# Patient Record
Sex: Female | Born: 1980 | Race: White | Hispanic: No | Marital: Married | Smoking: Former smoker
Health system: Southern US, Community
[De-identification: ages and names within clinical notes are randomized; demographics above are authoritative.]

## PROBLEM LIST (undated history)

## (undated) DIAGNOSIS — F329 Major depressive disorder, single episode, unspecified: Secondary | ICD-10-CM

## (undated) DIAGNOSIS — C8590 Non-Hodgkin lymphoma, unspecified, unspecified site: Secondary | ICD-10-CM

## (undated) DIAGNOSIS — F32A Depression, unspecified: Secondary | ICD-10-CM

## (undated) DIAGNOSIS — F419 Anxiety disorder, unspecified: Secondary | ICD-10-CM

## (undated) HISTORY — PX: LYMPH NODE BIOPSY: SHX201

## (undated) HISTORY — PX: HERNIA REPAIR: SHX51

---

## 2000-10-05 ENCOUNTER — Other Ambulatory Visit: Admission: RE | Admit: 2000-10-05 | Discharge: 2000-10-05 | Payer: Self-pay | Admitting: *Deleted

## 2002-09-09 ENCOUNTER — Encounter: Payer: Self-pay | Admitting: Emergency Medicine

## 2002-09-09 ENCOUNTER — Emergency Department (HOSPITAL_COMMUNITY): Admission: EM | Admit: 2002-09-09 | Discharge: 2002-09-09 | Payer: Self-pay | Admitting: Emergency Medicine

## 2005-01-31 ENCOUNTER — Emergency Department (HOSPITAL_COMMUNITY): Admission: EM | Admit: 2005-01-31 | Discharge: 2005-01-31 | Payer: Self-pay | Admitting: Emergency Medicine

## 2005-10-26 ENCOUNTER — Other Ambulatory Visit: Admission: RE | Admit: 2005-10-26 | Discharge: 2005-10-26 | Payer: Self-pay | Admitting: Obstetrics and Gynecology

## 2006-03-25 ENCOUNTER — Inpatient Hospital Stay (HOSPITAL_COMMUNITY): Admission: AD | Admit: 2006-03-25 | Discharge: 2006-03-25 | Payer: Self-pay | Admitting: *Deleted

## 2006-04-01 ENCOUNTER — Inpatient Hospital Stay (HOSPITAL_COMMUNITY): Admission: AD | Admit: 2006-04-01 | Discharge: 2006-04-03 | Payer: Self-pay | Admitting: Obstetrics and Gynecology

## 2006-04-01 ENCOUNTER — Encounter (INDEPENDENT_AMBULATORY_CARE_PROVIDER_SITE_OTHER): Payer: Self-pay | Admitting: *Deleted

## 2010-09-12 ENCOUNTER — Inpatient Hospital Stay (HOSPITAL_COMMUNITY)
Admission: EM | Admit: 2010-09-12 | Discharge: 2010-09-15 | Payer: Self-pay | Source: Home / Self Care | Attending: Internal Medicine | Admitting: Internal Medicine

## 2010-12-05 LAB — COMPREHENSIVE METABOLIC PANEL
ALT: 51 U/L — ABNORMAL HIGH (ref 0–35)
AST: 238 U/L — ABNORMAL HIGH (ref 0–37)
AST: 271 U/L — ABNORMAL HIGH (ref 0–37)
Albumin: 3 g/dL — ABNORMAL LOW (ref 3.5–5.2)
Calcium: 7.7 mg/dL — ABNORMAL LOW (ref 8.4–10.5)
Chloride: 99 mEq/L (ref 96–112)
Creatinine, Ser: 0.47 mg/dL (ref 0.4–1.2)
Creatinine, Ser: 0.56 mg/dL (ref 0.4–1.2)
GFR calc Af Amer: >60 mL/min (ref >60)
GFR calc Af Amer: >60 mL/min (ref >60)
GFR calc non Af Amer: >60 mL/min (ref >60)
Sodium: 136 mEq/L (ref 135–145)
Total Bilirubin: 5.3 mg/dL — ABNORMAL HIGH (ref 0.3–1.2)
Total Protein: 7.5 g/dL (ref 6.0–8.3)
Total Protein: 7.5 g/dL (ref 6.0–8.3)

## 2010-12-05 LAB — BASIC METABOLIC PANEL
BUN: 1 mg/dL — ABNORMAL LOW (ref 6–23)
CO2: 29 mEq/L (ref 19–32)
Chloride: 101 mEq/L (ref 96–112)
Creatinine, Ser: 0.47 mg/dL (ref 0.4–1.2)

## 2010-12-05 LAB — CBC
MCH: 37.4 pg — ABNORMAL HIGH (ref 26.0–34.0)
MCH: 37.7 pg — ABNORMAL HIGH (ref 26.0–34.0)
MCH: 37.8 pg — ABNORMAL HIGH (ref 26.0–34.0)
MCHC: 34 g/dL (ref 30.0–36.0)
MCV: 108.4 fL — ABNORMAL HIGH (ref 78.0–100.0)
Platelets: 60 10*3/uL — ABNORMAL LOW (ref 150–400)
Platelets: 85 10*3/uL — ABNORMAL LOW (ref 150–400)
RBC: 2.86 MIL/uL — ABNORMAL LOW (ref 3.87–5.11)
RDW: 15.4 % (ref 11.5–15.5)
RDW: 15.5 % (ref 11.5–15.5)
RDW: 15.5 % (ref 11.5–15.5)
WBC: 4 10*3/uL (ref 4.0–10.5)
WBC: 4.5 10*3/uL (ref 4.0–10.5)

## 2010-12-05 LAB — URINALYSIS, ROUTINE W REFLEX MICROSCOPIC
Glucose, UA: NEGATIVE mg/dL
Hgb urine dipstick: NEGATIVE
Ketones, ur: NEGATIVE mg/dL
Leukocytes, UA: NEGATIVE
Nitrite: NEGATIVE
Nitrite: NEGATIVE
Nitrite: POSITIVE — AB
Protein, ur: NEGATIVE mg/dL
Specific Gravity, Urine: 1.011 (ref 1.005–1.030)
Specific Gravity, Urine: 1.015 (ref 1.005–1.030)
Specific Gravity, Urine: 1.025 (ref 1.005–1.030)
Urobilinogen, UA: 1 mg/dL (ref 0.0–1.0)
Urobilinogen, UA: 2 mg/dL — ABNORMAL HIGH (ref 0.0–1.0)
Urobilinogen, UA: 4 mg/dL — ABNORMAL HIGH (ref 0.0–1.0)
pH: 6 (ref 5.0–8.0)
pH: 8.5 — ABNORMAL HIGH (ref 5.0–8.0)

## 2010-12-05 LAB — DIFFERENTIAL
Basophils Absolute: 0 10*3/uL (ref 0.0–0.1)
Basophils Absolute: 0 10*3/uL (ref 0.0–0.1)
Basophils Relative: 0 % (ref 0–1)
Basophils Relative: 0 % (ref 0–1)
Basophils Relative: 1 % (ref 0–1)
Eosinophils Absolute: 0 10*3/uL (ref 0.0–0.7)
Eosinophils Relative: 1 % (ref 0–5)
Eosinophils Relative: 1 % (ref 0–5)
Eosinophils Relative: 1 % (ref 0–5)
Lymphs Abs: 0.7 10*3/uL (ref 0.7–4.0)
Lymphs Abs: 0.8 10*3/uL (ref 0.7–4.0)
Monocytes Absolute: 0.4 10*3/uL (ref 0.1–1.0)
Monocytes Relative: 10 % (ref 3–12)
Monocytes Relative: 12 % (ref 3–12)
Neutro Abs: 2.7 10*3/uL (ref 1.7–7.7)
Neutrophils Relative %: 72 % (ref 43–77)

## 2010-12-05 LAB — URINE MICROSCOPIC-ADD ON

## 2010-12-05 LAB — HEPATIC FUNCTION PANEL
ALT: 42 U/L — ABNORMAL HIGH (ref 0–35)
Bilirubin, Direct: 6 mg/dL — ABNORMAL HIGH (ref 0.0–0.3)
Indirect Bilirubin: 2 mg/dL — ABNORMAL HIGH (ref 0.3–0.9)
Indirect Bilirubin: 4.1 mg/dL — ABNORMAL HIGH (ref 0.3–0.9)
Total Protein: 7.3 g/dL (ref 6.0–8.3)
Total Protein: 8.1 g/dL (ref 6.0–8.3)

## 2010-12-05 LAB — APTT: aPTT: 33 seconds (ref 24–37)

## 2010-12-05 LAB — ETHANOL: Alcohol, Ethyl (B): 199 mg/dL — ABNORMAL HIGH (ref 0–10)

## 2010-12-05 LAB — RAPID URINE DRUG SCREEN, HOSP PERFORMED
Benzodiazepines: POSITIVE — AB
Cocaine: NOT DETECTED

## 2010-12-05 LAB — POCT PREGNANCY, URINE: Preg Test, Ur: NEGATIVE

## 2010-12-05 LAB — MAGNESIUM: Magnesium: 1 mg/dL — ABNORMAL LOW (ref 1.5–2.5)

## 2010-12-05 LAB — PROTIME-INR
INR: 1.32 (ref 0.00–1.49)
Prothrombin Time: 16.6 seconds — ABNORMAL HIGH (ref 11.6–15.2)

## 2013-12-02 ENCOUNTER — Other Ambulatory Visit: Payer: Self-pay | Admitting: Obstetrics and Gynecology

## 2014-10-04 ENCOUNTER — Emergency Department (INDEPENDENT_AMBULATORY_CARE_PROVIDER_SITE_OTHER)
Admission: EM | Admit: 2014-10-04 | Discharge: 2014-10-04 | Disposition: A | Discharge status: Home / Self Care | Payer: BLUE CROSS/BLUE SHIELD | Source: Home / Self Care

## 2014-10-04 ENCOUNTER — Encounter: Payer: Self-pay | Admitting: Emergency Medicine

## 2014-10-04 DIAGNOSIS — R5383 Other fatigue: Secondary | ICD-10-CM

## 2014-10-04 HISTORY — DX: Non-Hodgkin lymphoma, unspecified, unspecified site: C85.90

## 2014-10-04 LAB — POCT URINALYSIS DIP (MANUAL ENTRY)
BILIRUBIN UA: NEGATIVE
Blood, UA: NEGATIVE
Glucose, UA: NEGATIVE
Ketones, POC UA: NEGATIVE
LEUKOCYTES UA: NEGATIVE
NITRITE UA: NEGATIVE
PROTEIN UA: NEGATIVE
Spec Grav, UA: 1.01 (ref 1.005–1.03)
Urobilinogen, UA: 0.2 (ref 0–1)
pH, UA: 7 (ref 5–8)

## 2014-10-04 NOTE — ED Notes (Signed)
Reports bladder/pelvic pressure for 'couple days'; did have increased vaginal discharge 3 weeks ago. Got her Flu vaccination 4-5 days ago. Reports some dizziness, nausea; LMP 09/24/14.

## 2014-10-04 NOTE — ED Provider Notes (Signed)
Courtney Hughes is a 34 y.o. female who presents to Urgent Care today for fatigue. Patient has 2 days of mild fatigue and body aches. She notes mild bladder/pelvic pressure. She denies urinary urinary frequency or urgency or pain with urination. No vaginal discharge or diarrhea. She received a flu vaccine a few days ago. She's not tried any medications yet. No night sweats weight loss trouble breathing cough congestion runny nose vomiting or diarrhea. She has remote history of Hodgkin's lymphoma as a teenager. No swollen lymph nodes    Past Medical History  Diagnosis Date  . Lymphoma     as teenager   History reviewed. No pertinent past surgical history. History  Substance Use Topics  . Smoking status: Never Smoker   . Smokeless tobacco: Not on file  . Alcohol Use: No   ROS as above Medications: No current facility-administered medications for this encounter.   Current Outpatient Prescriptions  Medication Sig Dispense Refill  . buPROPion (WELLBUTRIN SR) 150 MG 12 hr tablet Take 150 mg by mouth 2 (two) times daily.    Marland Kitchen zolpidem (AMBIEN) 10 MG tablet Take 10 mg by mouth at bedtime as needed for sleep.     No Known Allergies   Exam:  BP 103/71 mmHg  Pulse 85  Temp(Src) 98.1 F (36.7 C) (Oral)  Resp 16  Ht 5\' 7"  (1.702 m)  Wt 165 lb (74.844 kg)  BMI 25.84 kg/m2  SpO2 99%  LMP 09/24/2014 Gen: Well NAD nontoxic appearing HEENT: EOMI,  MMM posterior pharynx and tympanic membranes Lungs: Normal work of breathing. CTABL Heart: RRR no MRG Abd: NABS, Soft. Nondistended, Nontender no CV angle tenderness to percussion Exts: Brisk capillary refill, warm and well perfused.   Results for orders placed or performed during the hospital encounter of 10/04/14 (from the past 24 hour(s))  POCT urinalysis dipstick (new)     Status: None   Collection Time: 10/04/14 12:35 PM  Result Value Ref Range   Color, UA light yellow    Clarity, UA clear    Glucose, UA neg    Bilirubin, UA negative     Bilirubin, UA negative    Spec Grav, UA 1.010 1.005 - 1.03   Blood, UA negative    pH, UA 7.0 5 - 8   Protein Ur, POC negative    Urobilinogen, UA 0.2 0 - 1   Nitrite, UA Negative    Leukocytes, UA Negative    No results found.  Assessment and Plan: 34 y.o. female with fatigue. Unclear etiology. This may be a viral prodrome. For Tylenol and watchful waiting. Urine culture pending.  Discussed warning signs or symptoms. Please see discharge instructions. Patient expresses understanding.     Gregor Hams, MD 10/04/14 [phone]

## 2014-10-04 NOTE — Discharge Instructions (Signed)
Thank you for coming in today. Take Tylenol  return as needed   We will obtain a urine culture   Fatigue Fatigue is a feeling of tiredness, lack of energy, lack of motivation, or feeling tired all the time. Having enough rest, good nutrition, and reducing stress will normally reduce fatigue. Consult your caregiver if it persists. The nature of your fatigue will help your caregiver to find out its cause. The treatment is based on the cause.  CAUSES  There are many causes for fatigue. Most of the time, fatigue can be traced to one or more of your habits or routines. Most causes fit into one or more of three general areas. They are: Lifestyle problems  Sleep disturbances.  Overwork.  Physical exertion.  Unhealthy habits.  Poor eating habits or eating disorders.  Alcohol and/or drug use .  Lack of proper nutrition (malnutrition). Psychological problems  Stress and/or anxiety problems.  Depression.  Grief.  Boredom. Medical Problems or Conditions  Anemia.  Pregnancy.  Thyroid gland problems.  Recovery from major surgery.  Continuous pain.  Emphysema or asthma that is not well controlled  Allergic conditions.  Diabetes.  Infections (such as mononucleosis).  Obesity.  Sleep disorders, such as sleep apnea.  Heart failure or other heart-related problems.  Cancer.  Kidney disease.  Liver disease.  Effects of certain medicines such as antihistamines, cough and cold remedies, prescription pain medicines, heart and blood pressure medicines, drugs used for treatment of cancer, and some antidepressants. SYMPTOMS  The symptoms of fatigue include:   Lack of energy.  Lack of drive (motivation).  Drowsiness.  Feeling of indifference to the surroundings. DIAGNOSIS  The details of how you feel help guide your caregiver in finding out what is causing the fatigue. You will be asked about your present and past health condition. It is important to review all  medicines that you take, including prescription and non-prescription items. A thorough exam will be done. You will be questioned about your feelings, habits, and normal lifestyle. Your caregiver may suggest blood tests, urine tests, or other tests to look for common medical causes of fatigue.  TREATMENT  Fatigue is treated by correcting the underlying cause. For example, if you have continuous pain or depression, treating these causes will improve how you feel. Similarly, adjusting the dose of certain medicines will help in reducing fatigue.  HOME CARE INSTRUCTIONS   Try to get the required amount of good sleep every night.  Eat a healthy and nutritious diet, and drink enough water throughout the day.  Practice ways of relaxing (including yoga or meditation).  Exercise regularly.  Make plans to change situations that cause stress. Act on those plans so that stresses decrease over time. Keep your work and personal routine reasonable.  Avoid street drugs and minimize use of alcohol.  Start taking a daily multivitamin after consulting your caregiver. SEEK MEDICAL CARE IF:   You have persistent tiredness, which cannot be accounted for.  You have fever.  You have unintentional weight loss.  You have headaches.  You have disturbed sleep throughout the night.  You are feeling sad.  You have constipation.  You have dry skin.  You have gained weight.  You are taking any new or different medicines that you suspect are causing fatigue.  You are unable to sleep at night.  You develop any unusual swelling of your legs or other parts of your body. SEEK IMMEDIATE MEDICAL CARE IF:   You are feeling confused.  Your  vision is blurred.  You feel faint or pass out.  You develop severe headache.  You develop severe abdominal, pelvic, or back pain.  You develop chest pain, shortness of breath, or an irregular or fast heartbeat.  You are unable to pass a normal amount of  urine.  You develop abnormal bleeding such as bleeding from the rectum or you vomit blood.  You have thoughts about harming yourself or committing suicide.  You are worried that you might harm someone else. MAKE SURE YOU:   Understand these instructions.  Will watch your condition.  Will get help right away if you are not doing well or get worse. Document Released: 07/09/2007 Document Revised: 12/04/2011 Document Reviewed: 01/13/2014 [facility] Patient Information 2015 [place], [place]. This information is not intended to replace advice given to you by your health care provider. Make sure you discuss any questions you have with your health care provider.

## 2014-10-06 LAB — URINE CULTURE: Colony Count: 2000

## 2014-10-07 ENCOUNTER — Telehealth: Payer: Self-pay | Admitting: *Deleted

## 2014-12-09 ENCOUNTER — Other Ambulatory Visit: Payer: Self-pay | Admitting: Obstetrics and Gynecology

## 2014-12-10 LAB — CYTOLOGY - PAP

## 2015-01-06 ENCOUNTER — Other Ambulatory Visit (HOSPITAL_COMMUNITY): Payer: Self-pay | Admitting: Obstetrics and Gynecology

## 2015-01-06 DIAGNOSIS — O3680X1 Pregnancy with inconclusive fetal viability, fetus 1: Secondary | ICD-10-CM

## 2015-01-07 ENCOUNTER — Encounter (HOSPITAL_COMMUNITY): Payer: Self-pay

## 2015-01-07 ENCOUNTER — Ambulatory Visit (HOSPITAL_COMMUNITY)
Admission: RE | Admit: 2015-01-07 | Discharge: 2015-01-07 | Disposition: A | Discharge status: Home / Self Care | Payer: 59 | Source: Ambulatory Visit | Attending: Obstetrics and Gynecology | Admitting: Obstetrics and Gynecology

## 2015-01-07 DIAGNOSIS — Z3A09 9 weeks gestation of pregnancy: Secondary | ICD-10-CM | POA: Diagnosis not present

## 2015-01-07 DIAGNOSIS — O3680X1 Pregnancy with inconclusive fetal viability, fetus 1: Secondary | ICD-10-CM

## 2015-01-08 ENCOUNTER — Ambulatory Visit (HOSPITAL_COMMUNITY)
Admission: RE | Admit: 2015-01-08 | Payer: BLUE CROSS/BLUE SHIELD | Source: Ambulatory Visit | Admitting: Obstetrics and Gynecology

## 2015-01-08 ENCOUNTER — Encounter (HOSPITAL_COMMUNITY)
Admission: AD | Disposition: A | Discharge status: Home / Self Care | Payer: Self-pay | Source: Ambulatory Visit | Attending: Obstetrics and Gynecology

## 2015-01-08 ENCOUNTER — Ambulatory Visit (HOSPITAL_COMMUNITY)
Admission: AD | Admit: 2015-01-08 | Discharge: 2015-01-08 | Disposition: A | Discharge status: Home / Self Care | Payer: 59 | Source: Ambulatory Visit | Attending: Obstetrics and Gynecology | Admitting: Obstetrics and Gynecology

## 2015-01-08 ENCOUNTER — Inpatient Hospital Stay (HOSPITAL_COMMUNITY): Payer: 59 | Admitting: Anesthesiology

## 2015-01-08 ENCOUNTER — Encounter (HOSPITAL_COMMUNITY): Payer: Self-pay | Admitting: *Deleted

## 2015-01-08 DIAGNOSIS — O021 Missed abortion: Secondary | ICD-10-CM | POA: Insufficient documentation

## 2015-01-08 DIAGNOSIS — Z87891 Personal history of nicotine dependence: Secondary | ICD-10-CM | POA: Diagnosis not present

## 2015-01-08 HISTORY — DX: Depression, unspecified: F32.A

## 2015-01-08 HISTORY — DX: Major depressive disorder, single episode, unspecified: F32.9

## 2015-01-08 HISTORY — DX: Anxiety disorder, unspecified: F41.9

## 2015-01-08 HISTORY — PX: DILATION AND EVACUATION: SHX1459

## 2015-01-08 LAB — CBC
HCT: 35.6 % — ABNORMAL LOW (ref 36.0–46.0)
HEMOGLOBIN: 12.9 g/dL (ref 12.0–15.0)
MCH: 31.3 pg (ref 26.0–34.0)
MCHC: 36.2 g/dL — ABNORMAL HIGH (ref 30.0–36.0)
MCV: 86.4 fL (ref 78.0–100.0)
PLATELETS: 194 10*3/uL (ref 150–400)
RBC: 4.12 MIL/uL (ref 3.87–5.11)
RDW: 13 % (ref 11.5–15.5)
WBC: 9.8 10*3/uL (ref 4.0–10.5)

## 2015-01-08 LAB — ABO/RH: ABO/RH(D): O POS

## 2015-01-08 SURGERY — DILATION AND EVACUATION, UTERUS
Anesthesia: Monitor Anesthesia Care

## 2015-01-08 MED ORDER — FAMOTIDINE IN NACL 20-0.9 MG/50ML-% IV SOLN
20.0000 mg | Freq: Once | INTRAVENOUS | Status: AC
  Administered 2015-01-08: 20 mg via INTRAVENOUS
  Filled 2015-01-08: qty 50

## 2015-01-08 MED ORDER — PROMETHAZINE HCL 25 MG/ML IJ SOLN: 6.2500 mg | INTRAMUSCULAR | Status: DC | PRN

## 2015-01-08 MED ORDER — HYDROCODONE-ACETAMINOPHEN 5-325 MG PO TABS: ORAL_TABLET | ORAL | Status: DC

## 2015-01-08 MED ORDER — IBUPROFEN 600 MG PO TABS: 600.0000 mg | ORAL_TABLET | Freq: Four times a day (QID) | ORAL | Status: DC | PRN

## 2015-01-08 MED ORDER — CITRIC ACID-SODIUM CITRATE 334-500 MG/5ML PO SOLN
30.0000 mL | Freq: Once | ORAL | Status: AC
  Administered 2015-01-08: 30 mL via ORAL
  Filled 2015-01-08: qty 15

## 2015-01-08 MED ORDER — LACTATED RINGERS IV SOLN
INTRAVENOUS | Status: DC
Start: 2015-01-08 — End: 2015-01-08
  Administered 2015-01-08: 16:00:00 via INTRAVENOUS

## 2015-01-08 MED ORDER — LIDOCAINE HCL 1 % IJ SOLN
INTRAMUSCULAR | Status: AC
  Filled 2015-01-08: qty 20

## 2015-01-08 MED ORDER — KETOROLAC TROMETHAMINE 30 MG/ML IJ SOLN
INTRAMUSCULAR | Status: DC | PRN
  Administered 2015-01-08: 30 mg via INTRAVENOUS

## 2015-01-08 MED ORDER — LIDOCAINE HCL 1 % IJ SOLN
INTRAMUSCULAR | Status: DC | PRN
  Administered 2015-01-08: 10 mL

## 2015-01-08 MED ORDER — DEXAMETHASONE SODIUM PHOSPHATE 10 MG/ML IJ SOLN
INTRAMUSCULAR | Status: DC | PRN
  Administered 2015-01-08: 4 mg via INTRAVENOUS

## 2015-01-08 MED ORDER — MIDAZOLAM HCL 2 MG/2ML IJ SOLN
INTRAMUSCULAR | Status: DC | PRN
  Administered 2015-01-08: 2 mg via INTRAVENOUS

## 2015-01-08 MED ORDER — MEPERIDINE HCL 25 MG/ML IJ SOLN: 6.2500 mg | INTRAMUSCULAR | Status: DC | PRN

## 2015-01-08 MED ORDER — PROPOFOL 10 MG/ML IV BOLUS
INTRAVENOUS | Status: DC | PRN
  Administered 2015-01-08 (×5): 50 mg via INTRAVENOUS

## 2015-01-08 MED ORDER — FENTANYL CITRATE (PF) 100 MCG/2ML IJ SOLN: 25.0000 ug | INTRAMUSCULAR | Status: DC | PRN

## 2015-01-08 MED ORDER — FENTANYL CITRATE (PF) 100 MCG/2ML IJ SOLN
INTRAMUSCULAR | Status: DC | PRN
  Administered 2015-01-08: 100 ug via INTRAVENOUS

## 2015-01-08 MED ORDER — MIDAZOLAM HCL 2 MG/2ML IJ SOLN
INTRAMUSCULAR | Status: AC
Start: 2015-01-08 — End: 2015-01-08
  Filled 2015-01-08: qty 2

## 2015-01-08 MED ORDER — LIDOCAINE HCL (CARDIAC) 20 MG/ML IV SOLN
INTRAVENOUS | Status: DC | PRN
  Administered 2015-01-08: 50 mg via INTRAVENOUS

## 2015-01-08 MED ORDER — FENTANYL CITRATE (PF) 100 MCG/2ML IJ SOLN
INTRAMUSCULAR | Status: AC
  Filled 2015-01-08: qty 2

## 2015-01-08 MED ORDER — ONDANSETRON HCL 4 MG/2ML IJ SOLN
INTRAMUSCULAR | Status: DC | PRN
  Administered 2015-01-08: 4 mg via INTRAVENOUS

## 2015-01-08 SURGICAL SUPPLY — 17 items
CATH ROBINSON RED A/P 16FR (CATHETERS) ×2 IMPLANT
CLOTH BEACON ORANGE TIMEOUT ST (SAFETY) ×2 IMPLANT
DECANTER SPIKE VIAL GLASS SM (MISCELLANEOUS) ×2 IMPLANT
DILATOR CANAL MILEX (MISCELLANEOUS) IMPLANT
GLOVE BIO SURGEON STRL SZ7 (GLOVE) ×4 IMPLANT
GOWN STRL REUS W/TWL LRG LVL3 (GOWN DISPOSABLE) ×4 IMPLANT
KIT BERKELEY 1ST TRIMESTER 3/8 (MISCELLANEOUS) ×2 IMPLANT
NS IRRIG 1000ML POUR BTL (IV SOLUTION) ×2 IMPLANT
PACK VAGINAL MINOR WOMEN LF (CUSTOM PROCEDURE TRAY) ×2 IMPLANT
PAD OB MATERNITY 4.3X12.25 (PERSONAL CARE ITEMS) ×2 IMPLANT
PAD PREP 24X48 CUFFED NSTRL (MISCELLANEOUS) ×2 IMPLANT
SET BERKELEY SUCTION TUBING (SUCTIONS) ×2 IMPLANT
TOWEL OR 17X24 6PK STRL BLUE (TOWEL DISPOSABLE) ×4 IMPLANT
VACURETTE 10 RIGID CVD (CANNULA) ×2 IMPLANT
VACURETTE 7MM CVD STRL WRAP (CANNULA) IMPLANT
VACURETTE 8 RIGID CVD (CANNULA) IMPLANT
VACURETTE 9 RIGID CVD (CANNULA) IMPLANT

## 2015-01-08 NOTE — H&P (Signed)
Courtney Hughes is an 34 y.o. female. Presents for surgical management of a 9 week missed abortion  34 yo G2P1001 presents for surgical management of a missed abortion. The patient was seen in the office on 01/05/14 for a routine prenatal visit. An ultrasound exam demonstrated no cardiac activity c/w a missed abortion. The fetus was measuring 9+3. These findings were confirmed by a repeat ultrasound at [facility] on 01/07/2015. Pt desires surgical management. She was originally scheduled for surgery on Monday, however she did not wish to wait until then. Risks, benefits, and alternatives of the procedure were discussed with the patient at length.    Patient's last menstrual period was 10/25/2014.    Past Medical History  Diagnosis Date  . Lymphoma     as teenager    No past surgical history on file.  No family history on file.  Social History:  reports that she has never smoked. She does not have any smokeless tobacco history on file. She reports that she does not drink alcohol or use illicit drugs.  Allergies: No Known Allergies  Prescriptions prior to admission  Medication Sig Dispense Refill Last Dose  . buPROPion (WELLBUTRIN SR) 150 MG 12 hr tablet Take 150 mg by mouth daily.      . calcium carbonate (TUMS - DOSED IN MG ELEMENTAL CALCIUM) 500 MG chewable tablet Chew 2 tablets by mouth 2 (two) times daily as needed for indigestion or heartburn.     . Prenatal Vit-Fe Fumarate-FA (PRENATAL MULTIVITAMIN) TABS tablet Take 1 tablet by mouth daily at 12 noon.     Marland Kitchen zolpidem (AMBIEN) 10 MG tablet Take 5 mg by mouth at bedtime.        ROS: as above  Last menstrual period 10/25/2014. Physical Exam  AOX3, NAD Normocephalic, atraumatic Soft NT/ND  No results found for this or any previous visit (from the past 24 hour(s)).  US Ob Comp Less 14 Wks  01/07/2015   CLINICAL DATA:  Negative fetal heart tones.  EXAM: OBSTETRIC <14 WK [place] AND TRANSVAGINAL OB US  TECHNIQUE: Both transabdominal and  transvaginal ultrasound examinations were performed for complete evaluation of the gestation as well as the maternal uterus, adnexal regions, and pelvic cul-de-sac. Transvaginal technique was performed to assess early pregnancy.  COMPARISON:  None.  FINDINGS: Intrauterine gestational sac: Single  Yolk sac:  Present  Embryo:  Present  Cardiac Activity: Absent  CRL:  26  mm   9 w   3 d                  [place] EDC: 08/09/2015  Maternal uterus/adnexae:  Subchorionic hemorrhage: None  Right ovary: Normal  Left ovary: Normal  Other :None  Free fluid:  None  IMPRESSION: 1. Single intrauterine gestation without cardiac activity. Findings meet definitive criteria for failed pregnancy. This follows SRU consensus guidelines: Diagnostic Criteria for Nonviable Pregnancy Early in the First Trimester. Alison Stalling J Med [phone].   Electronically Signed   By: Kerby Moors M.D.   On: 01/07/2015 14:36   US Ob Transvaginal  01/07/2015   CLINICAL DATA:  Negative fetal heart tones.  EXAM: OBSTETRIC <14 WK [place] AND TRANSVAGINAL OB US  TECHNIQUE: Both transabdominal and transvaginal ultrasound examinations were performed for complete evaluation of the gestation as well as the maternal uterus, adnexal regions, and pelvic cul-de-sac. Transvaginal technique was performed to assess early pregnancy.  COMPARISON:  None.  FINDINGS: Intrauterine gestational sac: Single  Yolk sac:  Present  Embryo:  Present  Cardiac Activity: Absent  CRL:  26  mm   9 w   3 d                  [place] EDC: 08/09/2015  Maternal uterus/adnexae:  Subchorionic hemorrhage: None  Right ovary: Normal  Left ovary: Normal  Other :None  Free fluid:  None  IMPRESSION: 1. Single intrauterine gestation without cardiac activity. Findings meet definitive criteria for failed pregnancy. This follows SRU consensus guidelines: Diagnostic Criteria for Nonviable Pregnancy Early in the First Trimester. Alison Stalling J Med [phone].   Electronically Signed   By: Kerby Moors M.D.   On:  01/07/2015 14:36    Assessment/Plan: 34 yo G2P1001 @ 9+3 w/ missed ab for suction D & E 1) Consent for surgery 2) Check ABORH 3) Will d/w patient if she desires chromosomal studies 4) SCDs to OR   Dezyre Hoefer H. 01/08/2015, 3:46 PM

## 2015-01-08 NOTE — Transfer of Care (Signed)
Immediate Anesthesia Transfer of Care Note  Patient: Courtney Hughes  Procedure(s) Performed: Procedure(s): DILATATION AND EVACUATION (N/A)  Patient Location: PACU  Anesthesia Type:MAC  Level of Consciousness: awake, alert  and oriented  Airway & Oxygen Therapy: Patient Spontanous Breathing  Post-op Assessment: Report given to RN and Post -op Vital signs reviewed and stable  Post vital signs: Reviewed and stable  Last Vitals:  Filed Vitals:   01/08/15 1615  BP: 119/69  Pulse: 83  Temp: 36.6 C  Resp: 16    Complications: No apparent anesthesia complications

## 2015-01-08 NOTE — Op Note (Signed)
Pre-Operative Diagnosis: 1) 9 week missed abortion Postoperative Diagnosis: 1) Same Procedure: Suction dilation and evacuation Surgeon: Dr. Vanessa Kick Assistant: None Operative Findings: 9-10 week sized uterus, products of conception Specimen: products of conception EBL: minimal  Courtney Hughes Is a 35 year old gravida 2 para 1001 who presents for definitive surgical management for a 9 week missed abortion. Please see the patient's history and physical for complete details of the history. Management options were discussed with the patient. R/B/A reviewed. Following appropriate informed consent was taken to the operating room. The patient was appropriately identified during a time out procedure. MAC anesthesia was administered and the patient was placed in the dorsal lithotomy position. The patient was prepped and draped in the normal sterile fashion. A speculum was placed into the vagina, a single-tooth tenaculum was placed on the anterior lip of the cervix, and 10 cc of 1% lidocaine was administered in a paracervical fashion. The cervix was serially dilated with Hank dilators. A #10 suction curet was then passed to the fundus, the vacuum was engaged, and 3 suction passes were performed with a curette. A Sharp curettage was performed and a gritty texture was noted. A final suction pass was performed with minimal results. This completed the procedure. The patient tolerated the procedure well was brought to the recovery room in stable condition for the procedure. All sponge and needle counts correct x2.

## 2015-01-08 NOTE — Anesthesia Postprocedure Evaluation (Signed)
  Anesthesia Post-op Note  Patient: Courtney Hughes  Procedure(s) Performed: Procedure(s): DILATATION AND EVACUATION (N/A)  Patient Location: PACU  Anesthesia Type:MAC  Level of Consciousness: awake  Airway and Oxygen Therapy: Patient Spontanous Breathing  Post-op Pain: mild  Post-op Assessment: Post-op Vital signs reviewed, Patient's Cardiovascular Status Stable, Respiratory Function Stable, Patent Airway and No signs of Nausea or vomiting  Post-op Vital Signs: Reviewed and stable  Last Vitals:  Filed Vitals:   01/08/15 1615  BP: 119/69  Pulse: 83  Temp: 36.6 C  Resp: 16    Complications: No apparent anesthesia complications

## 2015-01-08 NOTE — Anesthesia Preprocedure Evaluation (Addendum)
Anesthesia Evaluation  Patient identified by MRN, date of birth, ID band Patient awake    Reviewed: Allergy & Precautions, Patient's Chart, lab work & pertinent test results, reviewed documented beta blocker date and time   Airway Mallampati: I   Neck ROM: Full    Dental  (+) Teeth Intact, Dental Advisory Given   Pulmonary neg pulmonary ROS, former smoker,  breath sounds clear to auscultation        Cardiovascular negative cardio ROS  Rhythm:Regular     Neuro/Psych    GI/Hepatic negative GI ROS,   Endo/Other  negative endocrine ROS  Renal/GU negative Renal ROS     Musculoskeletal   Abdominal (+)  Abdomen: soft.    Peds negative pediatric ROS (+)  Hematology   Anesthesia Other Findings   Reproductive/Obstetrics                           Anesthesia Physical Anesthesia Plan  ASA: I  Anesthesia Plan: MAC   Post-op Pain Management:    Induction: Intravenous  Airway Management Planned: Nasal ETT  Additional Equipment:   Intra-op Plan:   Post-operative Plan:   Informed Consent: I have reviewed the patients History and Physical, chart, labs and discussed the procedure including the risks, benefits and alternatives for the proposed anesthesia with the patient or authorized representative who has indicated his/her understanding and acceptance.     Plan Discussed with:   Anesthesia Plan Comments:         Anesthesia Quick Evaluation

## 2015-01-11 ENCOUNTER — Encounter (HOSPITAL_COMMUNITY): Payer: Self-pay | Admitting: Obstetrics and Gynecology

## 2016-02-25 ENCOUNTER — Emergency Department (INDEPENDENT_AMBULATORY_CARE_PROVIDER_SITE_OTHER)
Admission: EM | Admit: 2016-02-25 | Discharge: 2016-02-25 | Disposition: A | Discharge status: Home / Self Care | Payer: 59 | Source: Home / Self Care | Attending: Family Medicine | Admitting: Family Medicine

## 2016-02-25 DIAGNOSIS — R300 Dysuria: Secondary | ICD-10-CM

## 2016-02-25 DIAGNOSIS — N898 Other specified noninflammatory disorders of vagina: Secondary | ICD-10-CM

## 2016-02-25 DIAGNOSIS — R110 Nausea: Secondary | ICD-10-CM

## 2016-02-25 LAB — POCT URINALYSIS DIP (MANUAL ENTRY)
Bilirubin, UA: NEGATIVE
Glucose, UA: NEGATIVE
Ketones, POC UA: NEGATIVE
Leukocytes, UA: NEGATIVE
Nitrite, UA: POSITIVE — AB
Protein Ur, POC: NEGATIVE
Spec Grav, UA: 1.01 (ref 1.005–1.03)
Urobilinogen, UA: 0.2 (ref 0–1)
pH, UA: 6.5 (ref 5–8)

## 2016-02-25 LAB — POCT URINE PREGNANCY: Preg Test, Ur: NEGATIVE

## 2016-02-25 MED ORDER — ONDANSETRON 4 MG PO TBDP
4.0000 mg | ORAL_TABLET | Freq: Once | ORAL | Status: AC
  Administered 2016-02-25: 4 mg via ORAL

## 2016-02-25 MED ORDER — CEPHALEXIN 500 MG PO CAPS: 500.0000 mg | ORAL_CAPSULE | Freq: Two times a day (BID) | ORAL | Status: DC

## 2016-02-25 MED ORDER — ONDANSETRON HCL 4 MG PO TABS: 4.0000 mg | ORAL_TABLET | Freq: Four times a day (QID) | ORAL | Status: DC

## 2016-02-25 NOTE — ED Provider Notes (Signed)
CSN: [id number]     Arrival date & time 02/25/16  [id number] History   First MD Initiated Contact with Patient 02/25/16 0831     Chief Complaint  Patient presents with  . Urinary Frequency   (Consider location/radiation/quality/duration/timing/severity/associated sxs/prior Treatment) HPI  The pt is a 35yo female presenting to [facility] with c/o 2 weeks of urinary frequency and mild discomfort with associated nausea and vaginal itching with mild discharge.  She notes she started a new birth control about 3 months ago and initially had nausea, but it resolved for over 1 month so she is unsure if this new nausea is due to the birth control.  Denies fever, chills, vomiting or diarrhea. Symptoms are mild to moderate in severity. She did take 1 Azo last night w/o relief.  Past Medical History  Diagnosis Date  . Lymphoma ([place])     as teenager  . Anxiety   . Depression    Past Surgical History  Procedure Laterality Date  . Hernia repair    . Lymph node biopsy    . Dilation and evacuation N/A 01/08/2015    Procedure: DILATATION AND EVACUATION;  Surgeon: Vanessa Kick, MD;  Location: [facility];  Service: Gynecology;  Laterality: N/A;   History reviewed. No pertinent family history. Social History  Substance Use Topics  . Smoking status: Former Smoker    Quit date: 01/07/2010  . Smokeless tobacco: None  . Alcohol Use: No   OB History    Gravida Para Term Preterm AB TAB SAB Ectopic Multiple Living   2 1 1       1      Review of Systems  Constitutional: Negative for fever and chills.  HENT: Negative for congestion, ear pain, sore throat, trouble swallowing and voice change.   Respiratory: Negative for cough and shortness of breath.   Cardiovascular: Negative for chest pain and palpitations.  Gastrointestinal: Negative for nausea, vomiting, abdominal pain and diarrhea.  Genitourinary: Positive for dysuria, urgency, frequency and vaginal discharge. Negative for hematuria, flank pain, decreased urine volume,  vaginal bleeding, vaginal pain and pelvic pain.  Musculoskeletal: Negative for myalgias, back pain and arthralgias.  Skin: Negative for rash.    Allergies  Review of patient's allergies indicates no known allergies.  Home Medications   Prior to Admission medications   Medication Sig Start Date End Date Taking? Authorizing Provider  buPROPion (WELLBUTRIN SR) 150 MG 12 hr tablet Take 150 mg by mouth daily.     Historical Provider, MD  cephALEXin (KEFLEX) 500 MG capsule Take 1 capsule (500 mg total) by mouth 2 (two) times daily. For 7 days 02/25/16   Noland Fordyce, PA-C  HYDROcodone-acetaminophen (NORCO/VICODIN) 5-325 MG per tablet 1-2 tablets every 4-6 hours as needed for pain 01/08/15   Vanessa Kick, MD  ibuprofen (ADVIL,MOTRIN) 600 MG tablet Take 1 tablet (600 mg total) by mouth every 6 (six) hours as needed. 01/08/15   Vanessa Kick, MD  ondansetron (ZOFRAN) 4 MG tablet Take 1 tablet (4 mg total) by mouth every 6 (six) hours. 02/25/16   Noland Fordyce, PA-C  Prenatal Vit-Fe Fumarate-FA (PRENATAL MULTIVITAMIN) TABS tablet Take 1 tablet by mouth daily at 12 noon.    Historical Provider, MD  zolpidem (AMBIEN) 10 MG tablet Take 5 mg by mouth at bedtime.     Historical Provider, MD   Meds Ordered and Administered this Visit   Medications  ondansetron (ZOFRAN-ODT) disintegrating tablet 4 mg (4 mg Oral Given 02/25/16 0850)    BP 132/95 mmHg  Pulse 81  Temp(Src) 98.3 F (36.8 C) (Oral)  Ht 5\' 7"  (1.702 m)  Wt 166 lb 8 oz (75.524 kg)  BMI 26.07 kg/m2  SpO2 98%  LMP 02/17/2016  Breastfeeding? Unknown No data found.   Physical Exam  Constitutional: She appears well-developed and well-nourished. No distress.  HENT:  Head: Normocephalic and atraumatic.  Mouth/Throat: Oropharynx is clear and moist.  Eyes: Conjunctivae are normal. No scleral icterus.  Neck: Normal range of motion.  Cardiovascular: Normal rate, regular rhythm and normal heart sounds.   Pulmonary/Chest: Effort normal and breath  sounds normal. No respiratory distress. She has no wheezes. She has no rales.  Abdominal: Soft. She exhibits no distension and no mass. There is no tenderness. There is no rebound, no guarding and no CVA tenderness.  Musculoskeletal: Normal range of motion.  Neurological: She is alert.  Skin: Skin is warm and dry. She is not diaphoretic.  Nursing note and vitals reviewed.   ED Course  Procedures (including critical care time)  Labs Review Labs Reviewed  POCT URINALYSIS DIP (MANUAL ENTRY) - Abnormal; Notable for the following:    Blood, UA trace-intact (*)    Nitrite, UA Positive (*)    All other components within normal limits  URINE CULTURE  POCT URINE PREGNANCY    Imaging Review No results found.   MDM   1. Dysuria   2. Vaginal irritation   3. Nausea without vomiting    Pt c/o 2 weeks of urinary and vaginal symptoms.    UA: Nitrite positive but no leukocytes. Will send culture. Urine preg: Negative Discussed UA with pt, may be someone inaccurate due to recent dose of Azo.   Discussed pelvic to check for yeast or BV, pt declined stating she doesn't feel like she has a yeast infection.  Rx: Keflex and zofran  Encouraged to f/u with PCP and/or GYN in 1 week if not improving. Patient verbalized understanding and agreement with treatment plan.   Noland Fordyce, PA-C 02/25/16 0900

## 2016-02-25 NOTE — ED Notes (Signed)
Pt said she started about 2 weeks ago with urinary frequency and mild discomfort.  Also has had some itching and discharge.

## 2016-02-27 ENCOUNTER — Telehealth: Payer: Self-pay | Admitting: Emergency Medicine

## 2016-02-27 LAB — URINE CULTURE
Colony Count: NO GROWTH
Organism ID, Bacteria: NO GROWTH

## 2016-02-27 NOTE — ED Notes (Signed)
Pt informed of results of results.  Urine culture was negative.  [place]

## 2021-07-22 ENCOUNTER — Other Ambulatory Visit: Payer: Self-pay

## 2021-07-22 ENCOUNTER — Emergency Department (HOSPITAL_BASED_OUTPATIENT_CLINIC_OR_DEPARTMENT_OTHER): Payer: BC Managed Care – PPO

## 2021-07-22 ENCOUNTER — Emergency Department (HOSPITAL_BASED_OUTPATIENT_CLINIC_OR_DEPARTMENT_OTHER)
Admission: EM | Admit: 2021-07-22 | Discharge: 2021-07-23 | Disposition: A | Discharge status: Home / Self Care | Payer: BC Managed Care – PPO | Attending: Emergency Medicine | Admitting: Emergency Medicine

## 2021-07-22 ENCOUNTER — Encounter (HOSPITAL_BASED_OUTPATIENT_CLINIC_OR_DEPARTMENT_OTHER): Payer: Self-pay | Admitting: *Deleted

## 2021-07-22 DIAGNOSIS — S0990XA Unspecified injury of head, initial encounter: Secondary | ICD-10-CM | POA: Diagnosis not present

## 2021-07-22 DIAGNOSIS — Z87891 Personal history of nicotine dependence: Secondary | ICD-10-CM | POA: Insufficient documentation

## 2021-07-22 DIAGNOSIS — S42321A Displaced transverse fracture of shaft of humerus, right arm, initial encounter for closed fracture: Secondary | ICD-10-CM | POA: Insufficient documentation

## 2021-07-22 DIAGNOSIS — S4991XA Unspecified injury of right shoulder and upper arm, initial encounter: Secondary | ICD-10-CM | POA: Diagnosis present

## 2021-07-22 DIAGNOSIS — W108XXA Fall (on) (from) other stairs and steps, initial encounter: Secondary | ICD-10-CM | POA: Diagnosis not present

## 2021-07-22 MED ORDER — OXYCODONE HCL 5 MG PO TABS
2.5000 mg | ORAL_TABLET | Freq: Once | ORAL | Status: AC
  Administered 2021-07-22: 2.5 mg via ORAL
  Filled 2021-07-22: qty 1

## 2021-07-22 MED ORDER — ACETAMINOPHEN 500 MG PO TABS
1000.0000 mg | ORAL_TABLET | Freq: Once | ORAL | Status: AC
  Administered 2021-07-22: 1000 mg via ORAL
  Filled 2021-07-22: qty 2

## 2021-07-22 NOTE — ED Triage Notes (Signed)
She fell down 7 steps. Admits to ETOH. Injury to her right arm she thinks. Crepitus in her elbow. She is ambulatory.

## 2021-07-23 MED ORDER — OXYCODONE HCL 5 MG PO TABS: 2.5000 mg | ORAL_TABLET | Freq: Four times a day (QID) | ORAL | 0 refills | Status: DC | PRN

## 2021-07-23 MED ORDER — ONDANSETRON 4 MG PO TBDP
4.0000 mg | ORAL_TABLET | Freq: Once | ORAL | Status: AC
  Administered 2021-07-23: 4 mg via ORAL
  Filled 2021-07-23: qty 1

## 2021-07-23 MED ORDER — OXYCODONE HCL 5 MG PO TABS
2.5000 mg | ORAL_TABLET | Freq: Once | ORAL | Status: AC
  Administered 2021-07-23: 2.5 mg via ORAL
  Filled 2021-07-23: qty 1

## 2021-07-23 MED ORDER — KETOROLAC TROMETHAMINE 60 MG/2ML IM SOLN
30.0000 mg | Freq: Once | INTRAMUSCULAR | Status: AC
  Administered 2021-07-23: 30 mg via INTRAMUSCULAR
  Filled 2021-07-23: qty 2

## 2021-07-23 NOTE — Discharge Instructions (Signed)
For pain control you may take at 1000 mg of Tylenol every 8 hours scheduled.  In addition you can take 0.5 to 1 tablet of oxycodone every 6 hours as needed for pain not controlled with the scheduled Tylenol.

## 2021-07-23 NOTE — ED Provider Notes (Signed)
[facility] EMERGENCY DEPARTMENT Provider Note  CSN: [id number] Arrival date & time: 07/22/21 2148  Chief Complaint(s) Fall  HPI Courtney Hughes is a 40 y.o. female patient here with right proximal arm pain following a fall down a flight of stairs.  Patient was trying to scare her family when she slipped and tumbled down the steps.  She felt immediate pain to the right arm.  Pain is a deep ache worse with range of motion.  Noted to have shortening of the right upper extremity.  Still able to move her hand.  Sensation is intact. Denied any loss of consciousness.  Denies any headache, neck pain, back pain, chest pain, abdominal pain, or other extremity pain.  Patient admits to having 3 whiskey drinks tonight.   HPI  Past Medical History Past Medical History:  Diagnosis Date   Anxiety    Depression    Lymphoma ([place])    as teenager   There are no problems to display for this patient.  Home Medication(s) Prior to Admission medications   Medication Sig Start Date End Date Taking? Authorizing Provider  oxyCODONE (ROXICODONE) 5 MG immediate release tablet Take 0.5-1 tablets (2.5-5 mg total) by mouth every 6 (six) hours as needed for up to 5 days for severe pain. 07/23/21 07/28/21 Yes Burris Matherne, Grayce Sessions, MD  buPROPion [facility] SR) 150 MG 12 hr tablet Take 150 mg by mouth daily.     [provider]  cephALEXin (KEFLEX) 500 MG capsule Take 1 capsule (500 mg total) by mouth 2 (two) times daily. For 7 days 02/25/16   Noe Gens, PA-C  HYDROcodone-acetaminophen (NORCO/VICODIN) 5-325 MG per tablet 1-2 tablets every 4-6 hours as needed for pain 01/08/15   Vanessa Kick, MD  ibuprofen (ADVIL,MOTRIN) 600 MG tablet Take 1 tablet (600 mg total) by mouth every 6 (six) hours as needed. 01/08/15   Vanessa Kick, MD  ondansetron (ZOFRAN) 4 MG tablet Take 1 tablet (4 mg total) by mouth every 6 (six) hours. 02/25/16   Noe Gens, PA-C  Prenatal Vit-Fe Fumarate-FA (PRENATAL  MULTIVITAMIN) TABS tablet Take 1 tablet by mouth daily at 12 noon.    [provider]  zolpidem (AMBIEN) 10 MG tablet Take 5 mg by mouth at bedtime.     [provider]                                                                                                                                    Past Surgical History Past Surgical History:  Procedure Laterality Date   DILATION AND EVACUATION N/A 01/08/2015   Procedure: DILATATION AND EVACUATION;  Surgeon: Vanessa Kick, MD;  Location: [facility];  Service: Gynecology;  Laterality: N/A;   HERNIA REPAIR     LYMPH NODE BIOPSY     Family History No family history on file.  Social History Social History   Tobacco Use   Smoking status: Former  Types: Cigarettes    Quit date: 01/07/2010    Years since quitting: 11.5   Smokeless tobacco: Never  Substance Use Topics   Alcohol use: Yes   Drug use: No   Allergies Patient has no known allergies.  Review of Systems Review of Systems All other systems are reviewed and are negative for acute change except as noted in the HPI  Physical Exam Vital Signs  I have reviewed the triage vital signs BP (!) 141/90   Pulse 88   Temp 97.9 F (36.6 C) (Oral)   Resp 16   Ht 5\' 7"  (1.702 m)   Wt 83 kg   LMP 07/01/2021   SpO2 98%   BMI 28.66 kg/m   Physical Exam Constitutional:      General: She is not in acute distress.    Appearance: She is well-developed. She is not diaphoretic.  HENT:     Head: Normocephalic and atraumatic.     Right Ear: External ear normal.     Left Ear: External ear normal.     Nose: Nose normal.  Eyes:     General: No scleral icterus.       Right eye: No discharge.        Left eye: No discharge.     Conjunctiva/sclera: Conjunctivae normal.     Pupils: Pupils are equal, round, and reactive to light.  Cardiovascular:     Rate and Rhythm: Normal rate and regular rhythm.     Pulses:          Radial pulses are 2+ on the right side and 2+ on the  left side.       Dorsalis pedis pulses are 2+ on the right side and 2+ on the left side.     Heart sounds: Normal heart sounds. No murmur heard.   No friction rub. No gallop.  Pulmonary:     Effort: Pulmonary effort is normal. No respiratory distress.     Breath sounds: Normal breath sounds. No stridor. No wheezing.  Abdominal:     General: There is no distension.     Palpations: Abdomen is soft.     Tenderness: There is no abdominal tenderness.  Musculoskeletal:     Right upper arm: Deformity, tenderness and bony tenderness present.     Right hand: No tenderness. Normal range of motion. Normal strength. Normal sensation. Normal pulse.     Cervical back: Normal range of motion and neck supple. No bony tenderness.     Thoracic back: No bony tenderness.     Lumbar back: No bony tenderness.     Comments: Clavicles stable. Chest stable to AP/Lat compression. Pelvis stable to Lat compression. No obvious extremity deformity. No chest or abdominal wall contusion.  Skin:    General: Skin is warm and dry.     Findings: No erythema or rash.  Neurological:     Mental Status: She is alert and oriented to person, place, and time.     Comments: Moving all extremities    ED Results and Treatments Labs (all labs ordered are listed, but only abnormal results are displayed) Labs Reviewed - No data to display  EKG  EKG Interpretation  Date/Time:    Ventricular Rate:    PR Interval:    QRS Duration:   QT Interval:    QTC Calculation:   R Axis:     Text Interpretation:         Radiology DG Shoulder Right  Result Date: 07/22/2021 CLINICAL DATA:  [place] [address] downstairs, injury EXAM: RIGHT SHOULDER - 2+ VIEW COMPARISON:  None. FINDINGS: Internal rotation, external rotation, and transscapular views of the right shoulder are obtained. There is a transverse midshaft right humeral  diaphyseal fracture with valgus angulation at the fracture site. No other acute bony abnormalities. Right shoulder is well aligned. Visualized portions of the right chest are clear. IMPRESSION: 1. Mid right humeral diaphyseal fracture with valgus angulation. 2. Unremarkable right shoulder. Electronically Signed   By: Randa Ngo M.D.   On: 07/22/2021 22:42   CT Head Wo Contrast  Result Date: 07/23/2021 CLINICAL DATA:  Trauma, fall down steps, ETOH, head/neck injury EXAM: CT HEAD WITHOUT CONTRAST CT CERVICAL SPINE WITHOUT CONTRAST TECHNIQUE: Multidetector CT imaging of the head and cervical spine was performed following the standard protocol without intravenous contrast. Multiplanar CT image reconstructions of the cervical spine were also generated. COMPARISON:  None. FINDINGS: CT HEAD FINDINGS Brain: No evidence of acute infarction, hemorrhage, hydrocephalus, extra-axial collection or mass lesion/mass effect. Vascular: No hyperdense vessel or unexpected calcification. Skull: Normal. Negative for fracture or focal lesion. Sinuses/Orbits: The visualized paranasal sinuses are essentially clear. The mastoid air cells are unopacified. Other: None. CT CERVICAL SPINE FINDINGS Alignment: Normal cervical lordosis. Skull base and vertebrae: No acute fracture. No primary bone lesion or focal pathologic process. Soft tissues and spinal canal: No prevertebral fluid or swelling. No visible canal hematoma. Disc levels: Intervertebral disc spaces are maintained. Spinal canal is patent. Upper chest: Mild patchy opacity/scarring at the right lung apex. Other: None. IMPRESSION: Normal head CT. Normal cervical spine CT. Electronically Signed   By: Julian Hy M.D.   On: 07/23/2021 00:45   CT Cervical Spine Wo Contrast  Result Date: 07/23/2021 CLINICAL DATA:  Trauma, fall down steps, ETOH, head/neck injury EXAM: CT HEAD WITHOUT CONTRAST CT CERVICAL SPINE WITHOUT CONTRAST TECHNIQUE: Multidetector CT imaging of the head  and cervical spine was performed following the standard protocol without intravenous contrast. Multiplanar CT image reconstructions of the cervical spine were also generated. COMPARISON:  None. FINDINGS: CT HEAD FINDINGS Brain: No evidence of acute infarction, hemorrhage, hydrocephalus, extra-axial collection or mass lesion/mass effect. Vascular: No hyperdense vessel or unexpected calcification. Skull: Normal. Negative for fracture or focal lesion. Sinuses/Orbits: The visualized paranasal sinuses are essentially clear. The mastoid air cells are unopacified. Other: None. CT CERVICAL SPINE FINDINGS Alignment: Normal cervical lordosis. Skull base and vertebrae: No acute fracture. No primary bone lesion or focal pathologic process. Soft tissues and spinal canal: No prevertebral fluid or swelling. No visible canal hematoma. Disc levels: Intervertebral disc spaces are maintained. Spinal canal is patent. Upper chest: Mild patchy opacity/scarring at the right lung apex. Other: None. IMPRESSION: Normal head CT. Normal cervical spine CT. Electronically Signed   By: Julian Hy M.D.   On: 07/23/2021 00:45    Pertinent labs & imaging results that were available during my care of the patient were reviewed by me and considered in my medical decision making (see MDM for details).  Medications Ordered in ED Medications  acetaminophen (TYLENOL) tablet 1,000 mg (1,000 mg Oral Given 07/22/21 2356)  oxyCODONE (Oxy IR/ROXICODONE) immediate release tablet 2.5 mg (2.5 mg Oral  Given 07/22/21 2355)  ondansetron (ZOFRAN-ODT) disintegrating tablet 4 mg (4 mg Oral Given 07/23/21 0258)  ketorolac (TORADOL) injection 30 mg (30 mg Intramuscular Given 07/23/21 0259)                                                                                                                                     Procedures .Splint Application  Date/Time: 07/23/2021 3:56 AM Performed by: Fatima Blank, MD Authorized by: Fatima Blank, MD   Consent:    Consent obtained:  Verbal   Consent given by:  Patient   Risks discussed:  Discoloration, numbness, pain and swelling   Alternatives discussed:  Delayed treatment Universal protocol:    Procedure explained and questions answered to patient or proxy's satisfaction: yes     Patient identity confirmed:  Verbally with patient Pre-procedure details:    Distal neurologic exam:  Normal   Distal perfusion: distal pulses strong   Procedure details:    Location:  Arm   Arm location:  R upper arm   Upper extremity splint type: Coapt.   Supplies:  Fiberglass Post-procedure details:    Distal neurologic exam:  Normal   Procedure completion:  Tolerated  (including critical care time)  Medical Decision Making / ED Course I have reviewed the nursing notes for this encounter and the patient's prior records (if available in EHR or on provided paperwork).  GRACEE RATTERREE was evaluated in Emergency Department on 07/23/2021 for the symptoms described in the history of present illness. She was evaluated in the context of the global COVID-19 pandemic, which necessitated consideration that the patient might be at risk for infection with the SARS-CoV-2 virus that causes COVID-19. Institutional protocols and algorithms that pertain to the evaluation of patients at risk for COVID-19 are in a state of rapid change based on information released by regulatory bodies including the CDC and federal and state organizations. These policies and algorithms were followed during the patient's care in the ED.     Mechanical fall down a flight of stairs with a EtOH on board. Plain film notable for mid shaft humerus fracture. Neurovascular intact distally. Given distracting injury and EtOH on board, CT head and cervical spine obtained which were both negative.. No other injuries noted on exam requiring imaging.. Coapt splint applied. Sling provided. Ortho follow-up.  Pertinent labs &  imaging results that were available during my care of the patient were reviewed by me and considered in my medical decision making:    Final Clinical Impression(s) / ED Diagnoses Final diagnoses:  Closed displaced transverse fracture of shaft of right humerus, initial encounter   The patient appears reasonably screened and/or stabilized for discharge and I doubt any other medical condition or other [facility] requiring further screening, evaluation, or treatment in the ED at this time prior to discharge. Safe for discharge with strict return precautions.  Disposition: Discharge  Condition: Good  I  have discussed the results, Dx and Tx plan with the patient/family who expressed understanding and agree(s) with the plan. Discharge instructions discussed at length. The patient/family was given strict return precautions who verbalized understanding of the instructions. No further questions at time of discharge.    ED Discharge Orders          Ordered    oxyCODONE (ROXICODONE) 5 MG immediate release tablet  Every 6 hours PRN        07/23/21 0029            [facility] narcotic database reviewed and no active prescriptions noted.   Follow Up: Hiram Gash, MD [address] [place] [address] [place] [phone]  Call  to schedule an appointment for close follow up    This chart was dictated using voice recognition software.  Despite best efforts to proofread,  errors can occur which can change the documentation meaning.    Fatima Blank, MD 07/23/21 [phone]

## 2021-07-26 ENCOUNTER — Other Ambulatory Visit: Payer: Self-pay

## 2021-07-26 ENCOUNTER — Encounter (HOSPITAL_BASED_OUTPATIENT_CLINIC_OR_DEPARTMENT_OTHER): Payer: Self-pay | Admitting: Orthopaedic Surgery

## 2021-07-27 NOTE — H&P (Signed)
PREOPERATIVE H&P  Chief Complaint: right humerus shaft fracture  HPI: ZAHAVA QUANT is a 40 y.o. female who is scheduled for, Procedure(s): OPEN REDUCTION INTERNAL FIXATION (ORIF) DISTAL HUMERUS FRACTURE.   Patient has a past medical history significant for lymphoma as a teenager.   Mrs. Mcclatchey had an injury on 07/22/2021 when she fell down the stairs. She had immediate pain in the right arm. She went to [facility]. X-rays showed a humeral shaft fracture. She was sent to Dr. Griffin Basil for surgical discussion.   Her symptoms are rated as moderate to severe, and have been worsening.  This is significantly impairing activities of daily living.    Please see clinic note for further details on this patient's care.    She has elected for surgical management.   Past Medical History:  Diagnosis Date   Anxiety    Depression    Lymphoma [facility])    as teenager   Past Surgical History:  Procedure Laterality Date   DILATION AND EVACUATION N/A 01/08/2015   Procedure: DILATATION AND EVACUATION;  Surgeon: Vanessa Kick, MD;  Location: [facility];  Service: Gynecology;  Laterality: N/A;   HERNIA REPAIR     LYMPH NODE BIOPSY     Social History   Socioeconomic History   Marital status: Married    Spouse name: Not on file   Number of children: Not on file   Years of education: Not on file   Highest education level: Not on file  Occupational History   Not on file  Tobacco Use   Smoking status: Former    Types: Cigarettes    Quit date: 01/07/2010    Years since quitting: 11.5   Smokeless tobacco: Never  Vaping Use   Vaping Use: Never used  Substance and Sexual Activity   Alcohol use: Yes   Drug use: No   Sexual activity: Yes  Other Topics Concern   Not on file  Social History Narrative   Not on file   Social Determinants of Health   Financial Resource Strain: Not on file  Food Insecurity: Not on file  Transportation Needs: Not on file  Physical Activity: Not on file   Stress: Not on file  Social Connections: Not on file   History reviewed. No pertinent family history. No Known Allergies Prior to Admission medications   Medication Sig Start Date End Date Taking? Authorizing Provider  buPROPion (WELLBUTRIN SR) 150 MG 12 hr tablet Take 150 mg by mouth daily.    Yes [provider]  oxyCODONE (ROXICODONE) 5 MG immediate release tablet Take 0.5-1 tablets (2.5-5 mg total) by mouth every 6 (six) hours as needed for up to 5 days for severe pain. 07/23/21 07/28/21 Yes Cardama, Grayce Sessions, MD  sertraline (ZOLOFT) 100 MG tablet Take 100 mg by mouth daily.   Yes [provider]  zolpidem (AMBIEN) 10 MG tablet Take 5 mg by mouth at bedtime.   Yes [provider]    ROS: All other systems have been reviewed and were otherwise negative with the exception of those mentioned in the HPI and as above.  Physical Exam: General: Alert, no acute distress Cardiovascular: No pedal edema Respiratory: No cyanosis, no use of accessory musculature GI: No organomegaly, abdomen is soft and non-tender Skin: No lesions in the area of chief complaint Neurologic: Sensation intact distally Psychiatric: Patient is competent for consent with normal mood and affect Lymphatic: No axillary or cervical lymphadenopathy  MUSCULOSKELETAL:  Right arm: swelling  of right upper arm. ROM not tested in setting of known fracture. Tender to palpation right humerus. NVI  Imaging: Xrays from the ED demonstrate right humeral shaft fracture  Assessment: right humerus shaft fracture  Plan: Plan for Procedure(s): OPEN REDUCTION INTERNAL FIXATION (ORIF) DISTAL HUMERUS FRACTURE  The risks benefits and alternatives were discussed with the patient including but not limited to the risks of nonoperative treatment, versus surgical intervention including infection, bleeding, nerve injury,  blood clots, cardiopulmonary complications, morbidity, mortality, among others, and they  were willing to proceed.   The patient acknowledged the explanation, agreed to proceed with the plan and consent was signed.   Operative Plan: ORIF right humeral shaft fracture Discharge Medications: Standard + GBP DVT Prophylaxis: None Physical Therapy: Outpatient PT Special Discharge needs: Sling. [place], PA-C  07/27/2021 3:46 PM

## 2021-07-28 ENCOUNTER — Ambulatory Visit (HOSPITAL_BASED_OUTPATIENT_CLINIC_OR_DEPARTMENT_OTHER): Payer: BC Managed Care – PPO | Admitting: Certified Registered"

## 2021-07-28 ENCOUNTER — Ambulatory Visit (HOSPITAL_COMMUNITY): Payer: BC Managed Care – PPO

## 2021-07-28 ENCOUNTER — Encounter (HOSPITAL_BASED_OUTPATIENT_CLINIC_OR_DEPARTMENT_OTHER): Payer: Self-pay | Admitting: Orthopaedic Surgery

## 2021-07-28 ENCOUNTER — Encounter (HOSPITAL_BASED_OUTPATIENT_CLINIC_OR_DEPARTMENT_OTHER)
Admission: RE | Disposition: A | Discharge status: Home / Self Care | Payer: Self-pay | Source: Home / Self Care | Attending: Orthopaedic Surgery

## 2021-07-28 ENCOUNTER — Ambulatory Visit (HOSPITAL_BASED_OUTPATIENT_CLINIC_OR_DEPARTMENT_OTHER)
Admission: RE | Admit: 2021-07-28 | Discharge: 2021-07-28 | Disposition: A | Discharge status: Home / Self Care | Payer: BC Managed Care – PPO | Attending: Orthopaedic Surgery | Admitting: Orthopaedic Surgery

## 2021-07-28 ENCOUNTER — Other Ambulatory Visit: Payer: Self-pay

## 2021-07-28 DIAGNOSIS — Z87891 Personal history of nicotine dependence: Secondary | ICD-10-CM | POA: Insufficient documentation

## 2021-07-28 DIAGNOSIS — W109XXA Fall (on) (from) unspecified stairs and steps, initial encounter: Secondary | ICD-10-CM | POA: Insufficient documentation

## 2021-07-28 DIAGNOSIS — Z8572 Personal history of non-Hodgkin lymphomas: Secondary | ICD-10-CM | POA: Diagnosis not present

## 2021-07-28 DIAGNOSIS — Z419 Encounter for procedure for purposes other than remedying health state, unspecified: Secondary | ICD-10-CM

## 2021-07-28 DIAGNOSIS — S42351A Displaced comminuted fracture of shaft of humerus, right arm, initial encounter for closed fracture: Secondary | ICD-10-CM | POA: Diagnosis present

## 2021-07-28 HISTORY — PX: ORIF HUMERUS FRACTURE: SHX2126

## 2021-07-28 LAB — POCT PREGNANCY, URINE: Preg Test, Ur: NEGATIVE

## 2021-07-28 SURGERY — OPEN REDUCTION INTERNAL FIXATION (ORIF) DISTAL HUMERUS FRACTURE
Anesthesia: Monitor Anesthesia Care | Site: Arm Upper | Laterality: Right

## 2021-07-28 MED ORDER — ONDANSETRON HCL 4 MG/2ML IJ SOLN
INTRAMUSCULAR | Status: DC | PRN
  Administered 2021-07-28: 4 mg via INTRAVENOUS

## 2021-07-28 MED ORDER — TRANEXAMIC ACID-NACL 1000-0.7 MG/100ML-% IV SOLN
INTRAVENOUS | Status: AC
  Filled 2021-07-28: qty 100

## 2021-07-28 MED ORDER — MIDAZOLAM HCL 2 MG/2ML IJ SOLN
2.0000 mg | Freq: Once | INTRAMUSCULAR | Status: AC
  Administered 2021-07-28: 2 mg via INTRAVENOUS

## 2021-07-28 MED ORDER — ACETAMINOPHEN 500 MG PO TABS: 1000.0000 mg | ORAL_TABLET | Freq: Three times a day (TID) | ORAL | 0 refills | Status: AC

## 2021-07-28 MED ORDER — ROPIVACAINE HCL 5 MG/ML IJ SOLN
INTRAMUSCULAR | Status: DC | PRN
  Administered 2021-07-28: 30 mL via PERINEURAL

## 2021-07-28 MED ORDER — PROPOFOL 500 MG/50ML IV EMUL
INTRAVENOUS | Status: AC
  Filled 2021-07-28: qty 100

## 2021-07-28 MED ORDER — TRANEXAMIC ACID-NACL 1000-0.7 MG/100ML-% IV SOLN
1000.0000 mg | INTRAVENOUS | Status: AC
  Administered 2021-07-28: 1000 mg via INTRAVENOUS

## 2021-07-28 MED ORDER — 0.9 % SODIUM CHLORIDE (POUR BTL) OPTIME
TOPICAL | Status: DC | PRN
  Administered 2021-07-28: 200 mL

## 2021-07-28 MED ORDER — MIDAZOLAM HCL 2 MG/2ML IJ SOLN
INTRAMUSCULAR | Status: AC
  Filled 2021-07-28: qty 2

## 2021-07-28 MED ORDER — FENTANYL CITRATE (PF) 100 MCG/2ML IJ SOLN
INTRAMUSCULAR | Status: AC
  Filled 2021-07-28: qty 2

## 2021-07-28 MED ORDER — ONDANSETRON HCL 4 MG PO TABS: 4.0000 mg | ORAL_TABLET | Freq: Three times a day (TID) | ORAL | 0 refills | Status: AC | PRN

## 2021-07-28 MED ORDER — ACETAMINOPHEN 500 MG PO TABS: 1000.0000 mg | ORAL_TABLET | Freq: Once | ORAL | Status: DC

## 2021-07-28 MED ORDER — OXYCODONE HCL 5 MG PO TABS: ORAL_TABLET | ORAL | 0 refills | Status: AC

## 2021-07-28 MED ORDER — PROMETHAZINE HCL 25 MG/ML IJ SOLN: 6.2500 mg | INTRAMUSCULAR | Status: DC | PRN

## 2021-07-28 MED ORDER — GABAPENTIN 300 MG PO CAPS
300.0000 mg | ORAL_CAPSULE | Freq: Once | ORAL | Status: AC
  Administered 2021-07-28: 300 mg via ORAL

## 2021-07-28 MED ORDER — ONDANSETRON HCL 4 MG/2ML IJ SOLN
INTRAMUSCULAR | Status: AC
  Filled 2021-07-28: qty 2

## 2021-07-28 MED ORDER — DEXMEDETOMIDINE (PRECEDEX) IN NS 20 MCG/5ML (4 MCG/ML) IV SYRINGE
PREFILLED_SYRINGE | INTRAVENOUS | Status: AC
  Filled 2021-07-28: qty 5

## 2021-07-28 MED ORDER — GABAPENTIN 100 MG PO CAPS: 100.0000 mg | ORAL_CAPSULE | Freq: Three times a day (TID) | ORAL | 0 refills | Status: AC

## 2021-07-28 MED ORDER — HYDROMORPHONE HCL 1 MG/ML IJ SOLN: 0.2500 mg | INTRAMUSCULAR | Status: DC | PRN

## 2021-07-28 MED ORDER — GABAPENTIN 300 MG PO CAPS
ORAL_CAPSULE | ORAL | Status: AC
  Filled 2021-07-28: qty 1

## 2021-07-28 MED ORDER — VANCOMYCIN HCL 1000 MG IV SOLR
INTRAVENOUS | Status: DC | PRN
  Administered 2021-07-28: 1000 mg via TOPICAL

## 2021-07-28 MED ORDER — ACETAMINOPHEN 500 MG PO TABS
ORAL_TABLET | ORAL | Status: AC
  Filled 2021-07-28: qty 2

## 2021-07-28 MED ORDER — FENTANYL CITRATE (PF) 100 MCG/2ML IJ SOLN
100.0000 ug | Freq: Once | INTRAMUSCULAR | Status: AC
  Administered 2021-07-28: 100 ug via INTRAVENOUS

## 2021-07-28 MED ORDER — CEFAZOLIN SODIUM-DEXTROSE 2-4 GM/100ML-% IV SOLN
2.0000 g | INTRAVENOUS | Status: AC
  Administered 2021-07-28: 2 g via INTRAVENOUS

## 2021-07-28 MED ORDER — PROPOFOL 500 MG/50ML IV EMUL
INTRAVENOUS | Status: DC | PRN
  Administered 2021-07-28: 125 ug/kg/min via INTRAVENOUS
  Administered 2021-07-28 (×3): 20 mg via INTRAVENOUS

## 2021-07-28 MED ORDER — OXYCODONE HCL 5 MG/5ML PO SOLN: 5.0000 mg | Freq: Once | ORAL | Status: DC | PRN

## 2021-07-28 MED ORDER — LACTATED RINGERS IV SOLN: INTRAVENOUS | Status: DC

## 2021-07-28 MED ORDER — CELECOXIB 100 MG PO CAPS: 100.0000 mg | ORAL_CAPSULE | Freq: Two times a day (BID) | ORAL | 0 refills | Status: AC

## 2021-07-28 MED ORDER — OXYCODONE HCL 5 MG PO TABS: 5.0000 mg | ORAL_TABLET | Freq: Once | ORAL | Status: DC | PRN

## 2021-07-28 SURGICAL SUPPLY — 75 items
APL PRP STRL LF DISP 70% ISPRP (MISCELLANEOUS) ×1
APL SKNCLS STERI-STRIP NONHPOA (GAUZE/BANDAGES/DRESSINGS)
BENZOIN TINCTURE PRP APPL 2/3 (GAUZE/BANDAGES/DRESSINGS) IMPLANT
BIT DRILL 110X2.5XQCK CNCT (BIT) ×1 IMPLANT
BIT DRILL 2.5 (BIT) ×2
BIT DRILL QC 110 3.5 (BIT) ×1
BIT DRILL QC 110 3.5MM (BIT) ×1 IMPLANT
BIT DRILL QC 3.3X195 (BIT) ×2 IMPLANT
BIT DRL 110X2.5XQCK CNCT (BIT) ×1
BLADE HEX COATED 2.75 (ELECTRODE) ×2 IMPLANT
BLADE SURG 10 STRL SS (BLADE) ×2 IMPLANT
BLADE SURG 15 STRL LF DISP TIS (BLADE) ×1 IMPLANT
BLADE SURG 15 STRL SS (BLADE) ×2
BNDG CMPR 9X4 STRL LF SNTH (GAUZE/BANDAGES/DRESSINGS)
BNDG ELASTIC 4X5.8 VLCR STR LF (GAUZE/BANDAGES/DRESSINGS) ×2 IMPLANT
BNDG ESMARK 4X9 LF (GAUZE/BANDAGES/DRESSINGS) IMPLANT
CAP LOCK NCB (Cap) ×4 IMPLANT
CHLORAPREP W/TINT 26 (MISCELLANEOUS) ×2 IMPLANT
CLSR STERI-STRIP ANTIMIC 1/2X4 (GAUZE/BANDAGES/DRESSINGS) ×2 IMPLANT
CUFF TOURN SGL QUICK 24 (TOURNIQUET CUFF) ×2
CUFF TRNQT CYL 24X4X16.5-23 (TOURNIQUET CUFF) ×1 IMPLANT
DECANTER SPIKE VIAL GLASS SM (MISCELLANEOUS) IMPLANT
DRAPE C-ARM 42X72 X-RAY (DRAPES) ×2 IMPLANT
DRAPE EXTREMITY T 121X128X90 (DISPOSABLE) IMPLANT
DRAPE IMP U-DRAPE 54X76 (DRAPES) ×2 IMPLANT
DRAPE INCISE IOBAN 66X45 STRL (DRAPES) ×2 IMPLANT
DRAPE OEC MINIVIEW 54X84 (DRAPES) IMPLANT
DRAPE U-SHAPE 47X51 STRL (DRAPES) ×2 IMPLANT
DRAPE U-SHAPE 76X120 STRL (DRAPES) ×4 IMPLANT
DRILL BIT QC 110 3.5MM (BIT) ×2
DRSG AQUACEL AG ADV 3.5X10 (GAUZE/BANDAGES/DRESSINGS) ×4 IMPLANT
ELECT REM PT RETURN 9FT ADLT (ELECTROSURGICAL) ×2
ELECTRODE REM PT RTRN 9FT ADLT (ELECTROSURGICAL) ×1 IMPLANT
GAUZE SPONGE 4X4 12PLY STRL (GAUZE/BANDAGES/DRESSINGS) ×2 IMPLANT
GAUZE XEROFORM 1X8 LF (GAUZE/BANDAGES/DRESSINGS) IMPLANT
GAUZE XEROFORM 5X9 LF (GAUZE/BANDAGES/DRESSINGS) IMPLANT
GLOVE SRG 8 PF TXTR STRL LF DI (GLOVE) ×1 IMPLANT
GLOVE SURG ENC MOIS LTX SZ6.5 (GLOVE) ×2 IMPLANT
GLOVE SURG LTX SZ8 (GLOVE) ×2 IMPLANT
GLOVE SURG UNDER POLY LF SZ6.5 (GLOVE) ×2 IMPLANT
GLOVE SURG UNDER POLY LF SZ8 (GLOVE) ×2
GOWN STRL REUS W/ TWL LRG LVL3 (GOWN DISPOSABLE) ×2 IMPLANT
GOWN STRL REUS W/TWL LRG LVL3 (GOWN DISPOSABLE) ×4
GOWN STRL REUS W/TWL XL LVL3 (GOWN DISPOSABLE) ×2 IMPLANT
NS IRRIG 1000ML POUR BTL (IV SOLUTION) ×2 IMPLANT
PACK ARTHROSCOPY DSU (CUSTOM PROCEDURE TRAY) ×2 IMPLANT
PACK BASIN DAY SURGERY FS (CUSTOM PROCEDURE TRAY) ×2 IMPLANT
PAD CAST 4YDX4 CTTN HI CHSV (CAST SUPPLIES) IMPLANT
PADDING CAST COTTON 4X4 STRL (CAST SUPPLIES)
PENCIL SMOKE EVACUATOR (MISCELLANEOUS) ×2 IMPLANT
PLATE 8-HOLE STRAIGHT NCB (Plate) ×2 IMPLANT
SCREW CORT 2.5X30X3.5XST SM (Screw) ×1 IMPLANT
SCREW CORTICAL 3.5X26MM (Screw) ×2 IMPLANT
SCREW CORTICAL 3.5X30MM (Screw) ×2 IMPLANT
SCREW NCB 4.0 22MM (Screw) ×2 IMPLANT
SCREW NCB 4.0X20MM (Screw) ×2 IMPLANT
SCREW NCB 4.0X24MM (Screw) ×6 IMPLANT
SCREW NCB 4.0X26MM (Screw) ×2 IMPLANT
SLEEVE SCD COMPRESS KNEE MED (STOCKING) ×2 IMPLANT
SLING ARM FOAM STRAP LRG (SOFTGOODS) ×2 IMPLANT
SPLINT FAST PLASTER 5X30 (CAST SUPPLIES)
SPLINT PLASTER CAST FAST 5X30 (CAST SUPPLIES) IMPLANT
SPONGE T-LAP 18X18 ~~LOC~~+RFID (SPONGE) ×2 IMPLANT
STAPLER VISISTAT 35W (STAPLE) IMPLANT
SUT ETHILON 3 0 PS 1 (SUTURE) IMPLANT
SUT MNCRL AB 4-0 PS2 18 (SUTURE) ×2 IMPLANT
SUT VIC AB 0 CT1 27 (SUTURE) ×4
SUT VIC AB 0 CT1 27XBRD ANBCTR (SUTURE) ×2 IMPLANT
SUT VIC AB 2-0 SH 27 (SUTURE) ×2
SUT VIC AB 2-0 SH 27XBRD (SUTURE) ×1 IMPLANT
SUT VIC AB 3-0 SH 27 (SUTURE) ×4
SUT VIC AB 3-0 SH 27X BRD (SUTURE) ×2 IMPLANT
SYR BULB EAR ULCER 3OZ GRN STR (SYRINGE) ×2 IMPLANT
TOWEL GREEN STERILE FF (TOWEL DISPOSABLE) ×2 IMPLANT
TUBE SUCTION HIGH CAP CLEAR NV (SUCTIONS) ×2 IMPLANT

## 2021-07-28 NOTE — Interval H&P Note (Signed)
All questions answered, patient wants to proceed with procedure. ? ?

## 2021-07-28 NOTE — Anesthesia Procedure Notes (Signed)
Anesthesia Regional Block: Supraclavicular block   Pre-Anesthetic Checklist: , timeout performed,  Correct Patient, Correct Site, Correct Laterality,  Correct Procedure, Correct Position, site marked,  Risks and benefits discussed,  Surgical consent,  Pre-op evaluation,  At surgeon's request and post-op pain management  Laterality: Right  Prep: chloraprep       Needles:  Injection technique: Single-shot  Needle Type: Stimiplex     Needle Length: 9cm  Needle Gauge: 21     Additional Needles:   Procedures:,,,, ultrasound used (permanent image in chart),,    Narrative:  Start time: 07/28/2021 12:04 PM End time: 07/28/2021 12:09 PM Injection made incrementally with aspirations every 5 mL.  Performed by: Personally  Anesthesiologist: Lynda Rainwater, MD

## 2021-07-28 NOTE — Transfer of Care (Signed)
Immediate Anesthesia Transfer of Care Note  Patient: Courtney Hughes  Procedure(s) Performed: OPEN REDUCTION INTERNAL FIXATION (ORIF) DISTAL HUMERUS FRACTURE (Right: Arm Upper)  Patient Location: PACU  Anesthesia Type:MAC combined with regional for post-op pain  Level of Consciousness: awake, alert  and oriented  Airway & Oxygen Therapy: Patient Spontanous Breathing and Patient connected to face mask oxygen  Post-op Assessment: Report given to RN and Post -op Vital signs reviewed and stable  Post vital signs: Reviewed and stable  Last Vitals:  Vitals Value Taken Time  BP 119/77 07/28/21 1412  Temp    Pulse 74 07/28/21 1414  Resp 17 07/28/21 1414  SpO2 100 % 07/28/21 1414  Vitals shown include unvalidated device data.  Last Pain:  Vitals:   07/28/21 1122  TempSrc: Oral  PainSc: 0-No pain         Complications: No notable events documented.

## 2021-07-28 NOTE — Anesthesia Preprocedure Evaluation (Signed)
Anesthesia Evaluation  Patient identified by MRN, date of birth, ID band Patient awake    Reviewed: Allergy & Precautions, NPO status , Patient's Chart, lab work & pertinent test results, reviewed documented beta blocker date and time   Airway Mallampati: I   Neck ROM: Full    Dental  (+) Teeth Intact, Dental Advisory Given   Pulmonary neg pulmonary ROS, former smoker,    breath sounds clear to auscultation       Cardiovascular negative cardio ROS   Rhythm:Regular     Neuro/Psych Anxiety Depression negative neurological ROS  negative psych ROS   GI/Hepatic negative GI ROS, Neg liver ROS,   Endo/Other  negative endocrine ROS  Renal/GU negative Renal ROS  negative genitourinary   Musculoskeletal negative musculoskeletal ROS (+)   Abdominal (+) + obese,  Abdomen: soft.    Peds negative pediatric ROS (+)  Hematology negative hematology ROS (+)   Anesthesia Other Findings   Reproductive/Obstetrics negative OB ROS                             Anesthesia Physical  Anesthesia Plan  ASA: 2  Anesthesia Plan: MAC and Regional   Post-op Pain Management:    Induction: Intravenous  PONV Risk Score and Plan: 2 and Ondansetron, Midazolam and Treatment may vary due to age or medical condition  Airway Management Planned: Simple Face Mask  Additional Equipment:   Intra-op Plan:   Post-operative Plan:   Informed Consent: I have reviewed the patients History and Physical, chart, labs and discussed the procedure including the risks, benefits and alternatives for the proposed anesthesia with the patient or authorized representative who has indicated his/her understanding and acceptance.       Plan Discussed with:   Anesthesia Plan Comments:         Anesthesia Quick Evaluation

## 2021-07-28 NOTE — Discharge Instructions (Addendum)
Ophelia Charter MD, MPH Noemi Chapel, PA-C [facility] [address], [address] [phone] (tel)   [phone] (fax)   POST-OPERATIVE INSTRUCTIONS  WOUND CARE - Please keep dressing intact until followup.  - You may shower on Post-Op Day #2.  - The dressing is water resistant but do not scrub it as it may start to peel up.   - Gently pat the area dry.  - Do not soak the shoulder in water. Do not go swimming in the pool or ocean until your incision has completely healed (about 4-6 weeks after surgery) - KEEP THE INCISIONS CLEAN AND DRY.  EXERCISES - Please use your sling until your nerve block wears off - After that you may use your sling for comfort - You are okay to use your operative arm as you feel comfortable - DO NOT LIFT ANYTHING HEAVIER THAN 5 POUNDS WITH YOUR OPERATIVE ARM - Please continue to work on range of motion of your fingers and wrist and stretch these multiple times a day to prevent stiffness.  REGIONAL ANESTHESIA (NERVE BLOCKS) The anesthesia team may have performed a nerve block for you if safe in the setting of your care.  This is a great tool used to minimize pain.  Typically the block may start wearing off overnight but the long acting medicine may last for 3-4 days.  The nerve block wearing off can be a challenging period but please utilize your as needed pain medications to try and manage this period.    POST-OP MEDICATIONS- Multimodal approach to pain control In general your pain will be controlled with a combination of substances.  Prescriptions unless otherwise discussed are electronically sent to your pharmacy.  This is a carefully made plan we use to minimize narcotic use.     Celebrex - Anti-inflammatory medication taken on a scheduled basis Acetaminophen - Non-narcotic pain medicine taken on a scheduled basis  Gabapentin - this is a medication to help with nerve based pain, take on a scheduled basis Oxycodone - This is a strong  narcotic, to be used only on an "as needed" basis for SEVERE pain. Zofran - take as needed for nausea   FOLLOW-UP - If you develop a Fever (>101.5), Redness or Drainage from the surgical incision site, please call our office to arrange for an evaluation. - Please call the office to schedule a follow-up appointment for your incision check if you do not already have one, 7-10 days post-operatively.  IF YOU HAVE ANY QUESTIONS, PLEASE FEEL FREE TO CALL OUR OFFICE.  HELPFUL INFORMATION  If you had a block, it will wear off between 8-24 hrs postop typically.  This is period when your pain may go from nearly zero to the pain you would have had post-op without the block.  This is an abrupt transition but nothing dangerous is happening.  You may take an extra dose of narcotic when this happens.  Your arm will be in a sling following surgery.   We will let you know the exact duration at your follow-up visit.   You may be more comfortable sleeping in a semi-seated position the first few nights following surgery.  Keep a pillow propped under the elbow and forearm for comfort.  If you have a recliner type of chair it might be beneficial.    When dressing, put your operative arm in the sleeve first.  When getting undressed, take your operative arm out last.  Loose fitting, button-down shirts are recommended.  Often  in the first days after surgery you may be more comfortable keeping your operative arm under your shirt and not through the sleeve.  You may return to work/school in the next couple of days when you feel up to it. Desk work and typing in the sling is fine.  We suggest you use the pain medication the first night prior to going to bed, in order to ease any pain when the anesthesia wears off. You should avoid taking pain medications on an empty stomach as it will make you nauseous.  You should wean off your narcotic medicines as soon as you are able.  Most patients will be off or using minimal  narcotics before their first postop appointment.   Do not drink alcoholic beverages or take illicit drugs when taking pain medications.  In most states it is against the law to drive while your arm is in a sling. And certainly against the law to drive while taking narcotics.  Pain medication may make you constipated.  Below are a few solutions to try in this order: - Decrease the amount of pain medication if you aren't having pain. - Drink lots of decaffeinated fluids. - Drink prune juice and/or each dried prunes  If the first 3 don't work start with additional solutions - Take Colace - an over-the-counter stool softener - Take Senokot - an over-the-counter laxative - Take Miralax - a stronger over-the-counter laxative  For more information including helpful videos and documents visit our website:   [email or web address]    Post Anesthesia Home Care Instructions  Activity: Get plenty of rest for the remainder of the day. A responsible individual must stay with you for 24 hours following the procedure.  For the next 24 hours, DO NOT: -Drive a car -Paediatric nurse -Drink alcoholic beverages -Take any medication unless instructed by your physician -Make any legal decisions or sign important papers.  Meals: Start with liquid foods such as gelatin or soup. Progress to regular foods as tolerated. Avoid greasy, spicy, heavy foods. If nausea and/or vomiting occur, drink only clear liquids until the nausea and/or vomiting subsides. Call your physician if vomiting continues.  Special Instructions/Symptoms: Your throat may feel dry or sore from the anesthesia or the breathing tube placed in your throat during surgery. If this causes discomfort, gargle with warm salt water. The discomfort should disappear within 24 hours.  If you had a scopolamine patch placed behind your ear for the management of post- operative nausea and/or vomiting:  1. The medication  in the patch is effective for 72 hours, after which it should be removed.  Wrap patch in a tissue and discard in the trash. Wash hands thoroughly with soap and water. 2. You may remove the patch earlier than 72 hours if you experience unpleasant side effects which may include dry mouth, dizziness or visual disturbances. 3. Avoid touching the patch. Wash your hands with soap and water after contact with the patch.

## 2021-07-28 NOTE — Progress Notes (Signed)
Assisted Dr. Miller with right, ultrasound guided, supraclavicular block. Side rails up, monitors on throughout procedure. See vital signs in flow sheet. Tolerated Procedure well. 

## 2021-07-28 NOTE — Anesthesia Postprocedure Evaluation (Signed)
Anesthesia Post Note  Patient: Courtney Hughes  Procedure(s) Performed: OPEN REDUCTION INTERNAL FIXATION (ORIF) DISTAL HUMERUS FRACTURE (Right: Arm Upper)     Patient location during evaluation: PACU Anesthesia Type: Regional Level of consciousness: awake and alert Pain management: pain level controlled Vital Signs Assessment: post-procedure vital signs reviewed and stable Respiratory status: spontaneous breathing, nonlabored ventilation and respiratory function stable Cardiovascular status: blood pressure returned to baseline and stable Postop Assessment: no apparent nausea or vomiting Anesthetic complications: no   No notable events documented.  Last Vitals:  Vitals:   07/28/21 1430 07/28/21 1445  BP: 130/82 133/89  Pulse: 79 90  Resp: 17 18  Temp:  36.6 C  SpO2: 98% 96%    Last Pain:  Vitals:   07/28/21 1445  TempSrc: Oral  PainSc: 0-No pain                 Lynda Rainwater

## 2021-07-29 ENCOUNTER — Encounter (HOSPITAL_BASED_OUTPATIENT_CLINIC_OR_DEPARTMENT_OTHER): Payer: Self-pay | Admitting: Orthopaedic Surgery

## 2021-08-03 NOTE — Op Note (Signed)
Orthopaedic Surgery Operative Note (CSN: [id number])  Naama [place]  [birth year] Date of Surgery: 07/28/2021   Diagnoses:  right humerus shaft fracture  Procedure: Right humeral shaft open reduction fixation   Operative Finding Successful completion of the planned procedure.  Good purchase with all of our plate based screws though our lag screw was essentially just a position screw to hold the reduction.  The comminution at the fracture site meant that we did not have great purchase with a lag screw itself.  Post-operative plan: The patient will be weightbearing to 5 pounds and use of arm for ADLs appropriate.  The patient will be discharged home.  DVT prophylaxis not indicated in this ambulatory upper extremity patient without significant risk factors.   Pain control with PRN pain medication preferring oral medicines.  Follow up plan will be scheduled in approximately 7 days for incision check and XR.  Post-Op Diagnosis: Same Surgeons:Primary: Hiram Gash, MD Assistants:Caroline McBane PA-C Location: [facility] OR ROOM 6 Anesthesia: General with regional anesthesia Antibiotics: Ancef 2 g with local vancomycin powder 1 g at the surgical site Tourniquet time: none Estimated Blood Loss: Minimal Complications: None Specimens: None Implants: Implant Name Type Inv. Item Serial No. Manufacturer Lot No. LRB No. Used Action  SCREW NCB 4.0X26MM - [id number] Screw SCREW NCB 4.0X26MM  [employer](ORTH,TRAU,BIO,SG)  Right 1 Implanted  SCREW NCB 4.0X24MM - [id number] Screw SCREW NCB 4.0X24MM  [employer](ORTH,TRAU,BIO,SG)  Right 3 Implanted  SCREW NCB 4.0 22MM - [id number] Screw SCREW NCB 4.0 22MM  [employer](ORTH,TRAU,BIO,SG)  Right 1 Implanted  SCREW NCB 4.0X20MM - [id number] Screw SCREW NCB 4.0X20MM  [employer](ORTH,TRAU,BIO,SG)  Right 1 Implanted  PLATE 8-HOLE STRAIGHT NCB - [id number] Plate PLATE 8-HOLE STRAIGHT NCB  [employer](ORTH,TRAU,BIO,SG)  Right 1 Implanted  SCREW CORTICAL 3.5X26MM -  [id number] Screw SCREW CORTICAL 3.5X26MM  [employer](ORTH,TRAU,BIO,SG)  Right 1 Implanted  CAP LOCK NCB - [id number] Cap CAP LOCK NCB  [employer](ORTH,TRAU,BIO,SG)  Right 2 Implanted    Indications for Surgery:   ABIGAIL TEALL is a 40 y.o. female with fall resulting in a humeral shaft fracture that is relatively transverse and significantly displaced.  Benefits and risks of operative and nonoperative management were discussed prior to surgery with patient/guardian(s) and informed consent form was completed.  Specific risks including infection, need for additional surgery, neurovascular injury, nonunion, malunion and pain amongst others   Procedure:   The patient was identified properly. Informed consent was obtained and the surgical site was marked. The patient was taken up to suite where general anesthesia was induced.  The patient was positioned supine on a hand table.  The right arm was prepped and draped in the usual sterile fashion.  Timeout was performed before the beginning of the case.  We began with an anterior approach to the humerus.  He went to the midline of the upper arm making a 10 cm incision centered at the fracture site.  We then retracted the biceps medially after opening the bicipital fascia.  There was a rent in the brachialis which was extended in the midline.  We are able to expose the fracture site.  We used a curette and rondure to clear hematoma from the fracture site itself.  It was relatively oblique short fracture site but we were able to get a anatomic reduction and hold it with a position screw using a 3 5 titanium screw by technique.  Once an anatomic reduction we used a Biomet NCB humeral plate and obtained  6 cortices of fixation on each side of the fracture site.  We compressed with our first screw.  We put locking caps on the middle screw proximal and distal to the fracture site.  Good fixation was noted.  We irrigated the wound copiously before placing local  antibiotic as listed above.  We closed the incision in a multilayer fashion with absorbable suture.  Sterile dressing was placed.  Patient was awoken taken to PACU in stable condition.  Noemi Chapel, PA-C, present and scrubbed throughout the case, critical for completion in a timely fashion, and for retraction, instrumentation, closure.

## 2022-01-01 ENCOUNTER — Inpatient Hospital Stay (HOSPITAL_COMMUNITY)
Admission: EM | Admit: 2022-01-01 | Discharge: 2022-01-03 | DRG: 918 | Disposition: A | Discharge status: Home / Self Care | Payer: BC Managed Care – PPO | Attending: Internal Medicine | Admitting: Internal Medicine

## 2022-01-01 DIAGNOSIS — Z8571 Personal history of Hodgkin lymphoma: Secondary | ICD-10-CM

## 2022-01-01 DIAGNOSIS — R9431 Abnormal electrocardiogram [ECG] [EKG]: Secondary | ICD-10-CM

## 2022-01-01 DIAGNOSIS — R748 Abnormal levels of other serum enzymes: Secondary | ICD-10-CM | POA: Diagnosis not present

## 2022-01-01 DIAGNOSIS — T426X1A Poisoning by other antiepileptic and sedative-hypnotic drugs, accidental (unintentional), initial encounter: Secondary | ICD-10-CM | POA: Diagnosis not present

## 2022-01-01 DIAGNOSIS — E872 Acidosis, unspecified: Secondary | ICD-10-CM

## 2022-01-01 DIAGNOSIS — T484X1A Poisoning by expectorants, accidental (unintentional), initial encounter: Secondary | ICD-10-CM | POA: Diagnosis present

## 2022-01-01 DIAGNOSIS — F10129 Alcohol abuse with intoxication, unspecified: Secondary | ICD-10-CM | POA: Diagnosis present

## 2022-01-01 DIAGNOSIS — I350 Nonrheumatic aortic (valve) stenosis: Secondary | ICD-10-CM | POA: Diagnosis present

## 2022-01-01 DIAGNOSIS — T50902A Poisoning by unspecified drugs, medicaments and biological substances, intentional self-harm, initial encounter: Secondary | ICD-10-CM | POA: Diagnosis not present

## 2022-01-01 DIAGNOSIS — T424X1A Poisoning by benzodiazepines, accidental (unintentional), initial encounter: Secondary | ICD-10-CM | POA: Diagnosis present

## 2022-01-01 DIAGNOSIS — T465X1A Poisoning by other antihypertensive drugs, accidental (unintentional), initial encounter: Secondary | ICD-10-CM | POA: Diagnosis present

## 2022-01-01 DIAGNOSIS — F32A Depression, unspecified: Secondary | ICD-10-CM | POA: Diagnosis present

## 2022-01-01 DIAGNOSIS — Z79899 Other long term (current) drug therapy: Secondary | ICD-10-CM

## 2022-01-01 DIAGNOSIS — T1491XA Suicide attempt, initial encounter: Principal | ICD-10-CM

## 2022-01-01 DIAGNOSIS — T391X2A Poisoning by 4-Aminophenol derivatives, intentional self-harm, initial encounter: Secondary | ICD-10-CM

## 2022-01-01 DIAGNOSIS — T50901A Poisoning by unspecified drugs, medicaments and biological substances, accidental (unintentional), initial encounter: Secondary | ICD-10-CM | POA: Diagnosis present

## 2022-01-01 DIAGNOSIS — F1010 Alcohol abuse, uncomplicated: Secondary | ICD-10-CM

## 2022-01-01 DIAGNOSIS — Y904 Blood alcohol level of 80-99 mg/100 ml: Secondary | ICD-10-CM | POA: Diagnosis present

## 2022-01-01 DIAGNOSIS — E039 Hypothyroidism, unspecified: Secondary | ICD-10-CM | POA: Diagnosis present

## 2022-01-01 DIAGNOSIS — F419 Anxiety disorder, unspecified: Secondary | ICD-10-CM

## 2022-01-01 LAB — URINALYSIS, ROUTINE W REFLEX MICROSCOPIC
Bacteria, UA: NONE SEEN
Bilirubin Urine: NEGATIVE
Glucose, UA: NEGATIVE mg/dL
Ketones, ur: NEGATIVE mg/dL
Leukocytes,Ua: NEGATIVE
Nitrite: NEGATIVE
Protein, ur: NEGATIVE mg/dL
Specific Gravity, Urine: 1.025 (ref 1.005–1.030)
pH: 6 (ref 5.0–8.0)

## 2022-01-01 LAB — RAPID URINE DRUG SCREEN, HOSP PERFORMED
Amphetamines: NOT DETECTED
Barbiturates: NOT DETECTED
Benzodiazepines: POSITIVE — AB
Cocaine: NOT DETECTED
Opiates: NOT DETECTED
Tetrahydrocannabinol: NOT DETECTED

## 2022-01-01 LAB — COMPREHENSIVE METABOLIC PANEL
ALT: 26 U/L (ref 0–44)
AST: 36 U/L (ref 15–41)
Albumin: 3.2 g/dL — ABNORMAL LOW (ref 3.5–5.0)
Alkaline Phosphatase: 79 U/L (ref 38–126)
Anion gap: 9 (ref 5–15)
BUN: 10 mg/dL (ref 6–20)
CO2: 21 mmol/L — ABNORMAL LOW (ref 22–32)
Calcium: 7.9 mg/dL — ABNORMAL LOW (ref 8.9–10.3)
Chloride: 108 mmol/L (ref 98–111)
Creatinine, Ser: 0.86 mg/dL (ref 0.44–1.00)
GFR, Estimated: >60 mL/min (ref >60)
Glucose, Bld: 116 mg/dL — ABNORMAL HIGH (ref 70–99)
Potassium: 3.6 mmol/L (ref 3.5–5.1)
Sodium: 138 mmol/L (ref 135–145)
Total Bilirubin: 0.6 mg/dL (ref 0.3–1.2)
Total Protein: 6.3 g/dL — ABNORMAL LOW (ref 6.5–8.1)

## 2022-01-01 LAB — SALICYLATE LEVEL: Salicylate Lvl: <7 mg/dL — ABNORMAL LOW (ref 7.0–30.0)

## 2022-01-01 LAB — CBC WITH DIFFERENTIAL/PLATELET
Abs Immature Granulocytes: 0.02 10*3/uL (ref 0.00–0.07)
Basophils Absolute: 0.1 10*3/uL (ref 0.0–0.1)
Basophils Relative: 1 %
Eosinophils Absolute: 0.4 10*3/uL (ref 0.0–0.5)
Eosinophils Relative: 6 %
HCT: 38 % (ref 36.0–46.0)
Hemoglobin: 12.9 g/dL (ref 12.0–15.0)
Immature Granulocytes: 0 %
Lymphocytes Relative: 48 %
Lymphs Abs: 2.9 10*3/uL (ref 0.7–4.0)
MCH: 31.7 pg (ref 26.0–34.0)
MCHC: 33.9 g/dL (ref 30.0–36.0)
MCV: 93.4 fL (ref 80.0–100.0)
Monocytes Absolute: 0.3 10*3/uL (ref 0.1–1.0)
Monocytes Relative: 5 %
Neutro Abs: 2.3 10*3/uL (ref 1.7–7.7)
Neutrophils Relative %: 40 %
Platelets: 231 10*3/uL (ref 150–400)
RBC: 4.07 MIL/uL (ref 3.87–5.11)
RDW: 11.9 % (ref 11.5–15.5)
WBC: 6 10*3/uL (ref 4.0–10.5)
nRBC: 0 % (ref 0.0–0.2)

## 2022-01-01 LAB — I-STAT BETA HCG BLOOD, ED (MC, WL, AP ONLY): I-stat hCG, quantitative: <5 m[IU]/mL (ref <5)

## 2022-01-01 LAB — ACETAMINOPHEN LEVEL: Acetaminophen (Tylenol), Serum: 40 ug/mL — ABNORMAL HIGH (ref 10–30)

## 2022-01-01 LAB — ETHANOL: Alcohol, Ethyl (B): 82 mg/dL — ABNORMAL HIGH (ref <10)

## 2022-01-01 MED ORDER — ACETYLCYSTEINE LOAD VIA INFUSION
150.0000 mg/kg | Freq: Once | INTRAVENOUS | Status: AC
  Administered 2022-01-01: 8850 mg via INTRAVENOUS
  Filled 2022-01-01: qty 291

## 2022-01-01 MED ORDER — DEXTROSE 5 % IV SOLN: 15.0000 mg/kg/h | INTRAVENOUS | Status: DC

## 2022-01-01 MED ORDER — ACETYLCYSTEINE LOAD VIA INFUSION
150.0000 mg/kg | Freq: Once | INTRAVENOUS | Status: DC
  Filled 2022-01-01: qty 291

## 2022-01-01 MED ORDER — DEXTROSE 5 % IV SOLN
15.0000 mg/kg/h | INTRAVENOUS | Status: DC
  Administered 2022-01-02 (×2): 15 mg/kg/h via INTRAVENOUS
  Filled 2022-01-01 (×2): qty 90

## 2022-01-01 NOTE — ED Provider Notes (Signed)
?[facility] ?Provider Note ? ? ?CSN: [id number] ?Arrival date & time: 01/01/22  2119 ? ?  ? ?History ? ?Chief Complaint  ?Patient presents with  ? Ingestion  ?  BIB by ems, per husband he called ems due to not being able to get pt out of bed. Patient reports taking ambien, clonidine and alcohol tonight in an attempt to hard herself. Unknown amount taken. Denies HI. Reports this is her first attempt.   ? ? ?Courtney Hughes is a 41 y.o. female history of alcohol abuse recently discharged from rehab presenting to the ED after suicide attempt.  Patient endorsed taking Ambien, clonidine, clonazepam, and drinking wine.  She also endorses drinking a whole bottle of Mucinex night shift which has Tylenol, phenylephrine, diphenhydramine, and Triprolidine.  Patient also thinks she may have taken her trazodone.  Patient denies taking metoprolol.  Unsure if she took her Zoloft.  Patient reports she was trying to kill herself and wanted to die.  Husband called EMS.  Patient believes it was day out when she took and drink.  She has no idea what time this occurred. ? ? ? ?Ingestion ? ? ?  ? ?Home Medications ?Prior to Admission medications   ?Medication Sig Start Date End Date Taking? Authorizing Provider  ?buPROPion (WELLBUTRIN SR) 150 MG 12 hr tablet Take 150 mg by mouth daily.     [provider]  ?sertraline (ZOLOFT) 100 MG tablet Take 100 mg by mouth daily.    [provider]  ?zolpidem (AMBIEN) 10 MG tablet Take 5 mg by mouth at bedtime.    [provider]  ?   ? ?Allergies    ?Patient has no known allergies.   ? ?Review of Systems   ?Review of Systems  ?Unable to perform ROS: Mental status change  ? ?Physical Exam ?Updated Vital Signs ?BP 128/79   Pulse 79   Temp 98 ?F (36.7 ?C) (Oral)   Resp (!) 23   Ht '5\' 6"'$  (1.676 m)   Wt 59 kg   SpO2 95%   BMI 20.98 kg/m?  ?Physical Exam ?Constitutional:   ?   Comments: Somnolent, yet arousable  ?HENT:  ?   Head:  Normocephalic and atraumatic.  ?Eyes:  ?   Comments: 4 mm pupils bilaterally, significantly dilated but equal and reactive  ?Cardiovascular:  ?   Rate and Rhythm: Normal rate and regular rhythm.  ?Pulmonary:  ?   Effort: Pulmonary effort is normal. No respiratory distress.  ?Abdominal:  ?   Tenderness: There is no abdominal tenderness. There is no guarding or rebound.  ?Musculoskeletal:     ?   General: No swelling or tenderness.  ? ? ?ED Results / Procedures / Treatments   ?Labs ?(all labs ordered are listed, but only abnormal results are displayed) ?Labs Reviewed  ?COMPREHENSIVE METABOLIC PANEL - Abnormal; Notable for the following components:  ?    Result Value  ? CO2 21 (*)   ? Glucose, Bld 116 (*)   ? Calcium 7.9 (*)   ? Total Protein 6.3 (*)   ? Albumin 3.2 (*)   ? All other components within normal limits  ?ETHANOL - Abnormal; Notable for the following components:  ? Alcohol, Ethyl (B) 82 (*)   ? All other components within normal limits  ?RAPID URINE DRUG SCREEN, HOSP PERFORMED - Abnormal; Notable for the following components:  ? Benzodiazepines POSITIVE (*)   ? All other components within normal limits  ?  SALICYLATE LEVEL - Abnormal; Notable for the following components:  ? Salicylate Lvl <1.5 (*)   ? All other components within normal limits  ?ACETAMINOPHEN LEVEL - Abnormal; Notable for the following components:  ? Acetaminophen (Tylenol), Serum 40 (*)   ? All other components within normal limits  ?URINALYSIS, ROUTINE W REFLEX MICROSCOPIC - Abnormal; Notable for the following components:  ? Hgb urine dipstick LARGE (*)   ? All other components within normal limits  ?CK - Abnormal; Notable for the following components:  ? Total CK 368 (*)   ? All other components within normal limits  ?MAGNESIUM - Abnormal; Notable for the following components:  ? Magnesium 1.1 (*)   ? All other components within normal limits  ?RESP PANEL BY RT-PCR (FLU A&B, COVID) ARPGX2  ?CBC WITH DIFFERENTIAL/PLATELET  ?CBC   ?COMPREHENSIVE METABOLIC PANEL  ?ACETAMINOPHEN LEVEL  ?COMPREHENSIVE METABOLIC PANEL  ?HEPATIC FUNCTION PANEL  ?PROTIME-INR  ?I-STAT BETA HCG BLOOD, ED ([facility], [facility], [facility] ONLY)  ? ? ?EKG ?EKG Interpretation ? ?Date/Time:  Sunday January 01 2022 21:20:13 EDT ?Ventricular Rate:  68 ?PR Interval:  139 ?QRS Duration: 92 ?QT Interval:  483 ?QTC Calculation: 514 ?R Axis:   70 ?Text Interpretation: Sinus rhythm Prolonged QT interval Confirmed by Lennice Sites [phone]) on 01/01/2022 9:33:42 PM ? ?Radiology ?No results found. ? ?Procedures ?Procedures  ? ? ?Medications Ordered in ED ?Medications  ?acetylcysteine (ACETADOTE) 30.5 mg/mL load via infusion 8,850 mg (8,850 mg Intravenous Bolus from Bag 01/01/22 2352)  ?  Followed by  ?acetylcysteine (ACETADOTE) 18,000 mg in dextrose 5 % 590 mL (30.5085 mg/mL) infusion (has no administration in time range)  ?enoxaparin (LOVENOX) injection 40 mg (has no administration in time range)  ?0.9 %  sodium chloride infusion (has no administration in time range)  ?potassium chloride (KLOR-CON) packet 60 mEq (has no administration in time range)  ?magnesium sulfate IVPB 2 g 50 mL (has no administration in time range)  ?calcium gluconate 1 g/ 50 mL sodium chloride IVPB (has no administration in time range)  ? ? ?ED Course/ Medical Decision Making/ A&P ?Clinical Course as of 01/02/22 0035  ?Nancy Fetter Jan 01, 2022  ?2307 Mucinex Nightshift Severe cold & Flu liquid Max strength bottle of 148m.  ?Each 20 mL: Acetaminophen 650 mg Dextromethorphan HBr 20 mg Phenylephrine HCI 10 mg. Triprolidine HCl 2.5 mg  ? [RK]  ?2334 Suspect total tylenol of 5,850 mg due to patient stating she drank the whole bottle. [RK]  ?Mon Jan 02, 2022  ?0031 Acetaminophen (Tylenol), S(!): 40 [RK]  ?  ?Clinical Course User Index ?[RK] KLupita Dawn MD  ? ?                        ?Medical Decision Making ?Amount and/or Complexity of Data Reviewed ?Labs: ordered. Decision-making details documented in ED Course. ? ?Risk ?Decision regarding  hospitalization. ? ? ?41year old female with history of alcoholism presenting to the ED for intentional overdose on polysubstances. ? ?On exam, dilated pupils, hyperreflexic, dry mouth, hyperactive bowel sounds, and somnolent yet arousable. ? ?Labs remarkable for acetaminophen level 40, alcohol level 82, CO2 21, glucose of 116, calcium 7.9, protein 6.3, albumin 3.2.  UA overall reassuring.  UDS positive for benzos.  Magnesium of 1.1.  CK of 368.  EKG on my review shows prolonged QT.  Poison control spoken with and recommending acetylcysteine bolus and infusion for 23 hours which has been ordered.  Additionally recommend repeating a 4-hour Tylenol on  level and hepatic function level every 4 hours.  ? ?Due to significant amount of substances ingested, will admit to hospitalist for further observation/medical management of her overdose.  Patient IVC'd.  Case discussed with hospitalist who agrees with admission. ? ?Patient seen in conjunction with my attending Dr. Ronnald Nian. ? ?Final Clinical Impression(s) / ED Diagnoses ?Final diagnoses:  ?Suicide attempt [facility])  ?Intentional overdose, initial encounter [facility])  ?Intentional acetaminophen overdose, initial encounter ([facility])  ? ? ?Rx / DC Orders ?ED Discharge Orders   ? ? None  ? ?  ? ? ?  ?Lupita Dawn, MD ?01/02/22 0035 ? ?  ?Lennice Sites, DO ?01/04/22 1510 ? ?

## 2022-01-01 NOTE — ED Provider Notes (Signed)
Shared resident visit.  Patient arrives after suicide attempt with polypharmacy overdose.  Husband told EMS that patient has been drinking alcohol, taking extra doses of Ambien and clonazepam clonidine.  Patient admits to these as well.  She has prescriptions for Zoloft, trazodone as well.  I found an empty bottle of Mucinex nighttime which contains Benadryl and Tylenol as well.  Estimate about 3900 mg of Tylenol.  Can in the pill bottles probably a few extra doses of Ambien and clonazepam clonidine are unaccounted for.  She has normal vitals.  EKG shows slightly prolonged QTc at 515.  Drug screen is positive for benzos.  Pregnancy test is negative.  Urinalysis negative for infection.  There is no significant electrolyte abnormality.  Liver enzymes are normal.  No significant anemia.  Per my further review and interpretation of her labs her alcohol level is 82.  But more importantly her Tylenol level is 40.  I talked to poison control on the phone and given that the Tylenol level is elevated to 40 without known time they are recommending acetylcysteine bolus and infusion for 23 hours.  We will repeat 4-hour Tylenol level as well as repeat hepatic function level every 4 hours.  She is not having any GI symptoms at this time.  IVC has been filled.  Exam is overall unremarkable.  She is somnolent but she is following commands and she is talking.  She is asking for something to drink.  Overall patient to be admitted to hospitalist service for further care for polypharmacy overdose, Tylenol overdose, suicidal attempt. ? ?This chart was dictated using voice recognition software.  Despite best efforts to proofread,  errors can occur which can change the documentation meaning. ? ?.Critical Care ?Performed by: Lennice Sites, DO ?Authorized by: Lennice Sites, DO  ? ?Critical care provider statement:  ?  Critical care time (minutes):  40 ?  Critical care time was exclusive of:  Separately billable procedures and treating  other patients ?  Critical care was necessary to treat or prevent imminent or life-threatening deterioration of the following conditions:  Toxidrome ?  Critical care was time spent personally by me on the following activities:  Blood draw for specimens, development of treatment plan with patient or surrogate, discussions with consultants, evaluation of patient's response to treatment, examination of patient, obtaining history from patient or surrogate, ordering and performing treatments and interventions, ordering and review of laboratory studies, ordering and review of radiographic studies, pulse oximetry, re-evaluation of patient's condition and review of old charts ?  Care discussed with: admitting provider   ? ?  ?Lennice Sites, DO ?01/01/22 2341 ? ?

## 2022-01-02 ENCOUNTER — Encounter (HOSPITAL_COMMUNITY): Payer: Self-pay | Admitting: Family Medicine

## 2022-01-02 ENCOUNTER — Other Ambulatory Visit: Payer: Self-pay

## 2022-01-02 DIAGNOSIS — R9431 Abnormal electrocardiogram [ECG] [EKG]: Secondary | ICD-10-CM | POA: Diagnosis present

## 2022-01-02 DIAGNOSIS — T484X1A Poisoning by expectorants, accidental (unintentional), initial encounter: Secondary | ICD-10-CM | POA: Diagnosis present

## 2022-01-02 DIAGNOSIS — R748 Abnormal levels of other serum enzymes: Secondary | ICD-10-CM | POA: Diagnosis present

## 2022-01-02 DIAGNOSIS — T426X1A Poisoning by other antiepileptic and sedative-hypnotic drugs, accidental (unintentional), initial encounter: Secondary | ICD-10-CM | POA: Diagnosis present

## 2022-01-02 DIAGNOSIS — F32A Depression, unspecified: Secondary | ICD-10-CM | POA: Diagnosis present

## 2022-01-02 DIAGNOSIS — T50901A Poisoning by unspecified drugs, medicaments and biological substances, accidental (unintentional), initial encounter: Secondary | ICD-10-CM | POA: Diagnosis not present

## 2022-01-02 DIAGNOSIS — T50904A Poisoning by unspecified drugs, medicaments and biological substances, undetermined, initial encounter: Secondary | ICD-10-CM | POA: Diagnosis not present

## 2022-01-02 DIAGNOSIS — Y904 Blood alcohol level of 80-99 mg/100 ml: Secondary | ICD-10-CM | POA: Diagnosis present

## 2022-01-02 DIAGNOSIS — T465X1A Poisoning by other antihypertensive drugs, accidental (unintentional), initial encounter: Secondary | ICD-10-CM | POA: Diagnosis present

## 2022-01-02 DIAGNOSIS — Z8571 Personal history of Hodgkin lymphoma: Secondary | ICD-10-CM | POA: Diagnosis not present

## 2022-01-02 DIAGNOSIS — E872 Acidosis, unspecified: Secondary | ICD-10-CM | POA: Diagnosis present

## 2022-01-02 DIAGNOSIS — F1010 Alcohol abuse, uncomplicated: Secondary | ICD-10-CM | POA: Diagnosis not present

## 2022-01-02 DIAGNOSIS — F419 Anxiety disorder, unspecified: Secondary | ICD-10-CM

## 2022-01-02 DIAGNOSIS — Z79899 Other long term (current) drug therapy: Secondary | ICD-10-CM | POA: Diagnosis not present

## 2022-01-02 DIAGNOSIS — I350 Nonrheumatic aortic (valve) stenosis: Secondary | ICD-10-CM | POA: Diagnosis present

## 2022-01-02 DIAGNOSIS — T424X1A Poisoning by benzodiazepines, accidental (unintentional), initial encounter: Secondary | ICD-10-CM | POA: Diagnosis present

## 2022-01-02 DIAGNOSIS — E039 Hypothyroidism, unspecified: Secondary | ICD-10-CM | POA: Diagnosis present

## 2022-01-02 DIAGNOSIS — F10129 Alcohol abuse with intoxication, unspecified: Secondary | ICD-10-CM | POA: Diagnosis present

## 2022-01-02 LAB — COMPREHENSIVE METABOLIC PANEL
ALT: 27 U/L (ref 0–44)
AST: 34 U/L (ref 15–41)
Albumin: 3 g/dL — ABNORMAL LOW (ref 3.5–5.0)
Alkaline Phosphatase: 75 U/L (ref 38–126)
Anion gap: 10 (ref 5–15)
BUN: 8 mg/dL (ref 6–20)
CO2: 19 mmol/L — ABNORMAL LOW (ref 22–32)
Calcium: 8.1 mg/dL — ABNORMAL LOW (ref 8.9–10.3)
Chloride: 107 mmol/L (ref 98–111)
Creatinine, Ser: 0.79 mg/dL (ref 0.44–1.00)
GFR, Estimated: >60 mL/min (ref >60)
Glucose, Bld: 126 mg/dL — ABNORMAL HIGH (ref 70–99)
Potassium: 4.3 mmol/L (ref 3.5–5.1)
Sodium: 136 mmol/L (ref 135–145)
Total Bilirubin: 0.8 mg/dL (ref 0.3–1.2)
Total Protein: 6.2 g/dL — ABNORMAL LOW (ref 6.5–8.1)

## 2022-01-02 LAB — CBC
HCT: 37.1 % (ref 36.0–46.0)
Hemoglobin: 12.7 g/dL (ref 12.0–15.0)
MCH: 31.6 pg (ref 26.0–34.0)
MCHC: 34.2 g/dL (ref 30.0–36.0)
MCV: 92.3 fL (ref 80.0–100.0)
Platelets: 180 10*3/uL (ref 150–400)
RBC: 4.02 MIL/uL (ref 3.87–5.11)
RDW: 11.7 % (ref 11.5–15.5)
WBC: 9.1 10*3/uL (ref 4.0–10.5)
nRBC: 0 % (ref 0.0–0.2)

## 2022-01-02 LAB — HEPATIC FUNCTION PANEL
ALT: 26 U/L (ref 0–44)
AST: 39 U/L (ref 15–41)
Albumin: 3.5 g/dL (ref 3.5–5.0)
Alkaline Phosphatase: 83 U/L (ref 38–126)
Bilirubin, Direct: 0.2 mg/dL (ref 0.0–0.2)
Indirect Bilirubin: 0.8 mg/dL (ref 0.3–0.9)
Total Bilirubin: 1 mg/dL (ref 0.3–1.2)
Total Protein: 7 g/dL (ref 6.5–8.1)

## 2022-01-02 LAB — PROTIME-INR
INR: 1.1 (ref 0.8–1.2)
Prothrombin Time: 14.1 seconds (ref 11.4–15.2)

## 2022-01-02 LAB — ACETAMINOPHEN LEVEL: Acetaminophen (Tylenol), Serum: <10 ug/mL — ABNORMAL LOW (ref 10–30)

## 2022-01-02 LAB — MAGNESIUM
Magnesium: 1.1 mg/dL — ABNORMAL LOW (ref 1.7–2.4)
Magnesium: 2.1 mg/dL (ref 1.7–2.4)

## 2022-01-02 LAB — CK: Total CK: 368 U/L — ABNORMAL HIGH (ref 38–234)

## 2022-01-02 MED ORDER — ENOXAPARIN SODIUM 40 MG/0.4ML IJ SOSY
40.0000 mg | PREFILLED_SYRINGE | Freq: Every day | INTRAMUSCULAR | Status: DC
  Administered 2022-01-02 – 2022-01-03 (×2): 40 mg via SUBCUTANEOUS
  Filled 2022-01-02 (×2): qty 0.4

## 2022-01-02 MED ORDER — CLONAZEPAM 0.5 MG PO TABS
1.0000 mg | ORAL_TABLET | Freq: Two times a day (BID) | ORAL | Status: DC | PRN
  Administered 2022-01-02 – 2022-01-03 (×2): 1 mg via ORAL
  Filled 2022-01-02 (×2): qty 2

## 2022-01-02 MED ORDER — POTASSIUM CHLORIDE 20 MEQ PO PACK
60.0000 meq | PACK | Freq: Once | ORAL | Status: AC
  Administered 2022-01-02: 60 meq via ORAL
  Filled 2022-01-02: qty 3

## 2022-01-02 MED ORDER — IBUPROFEN 400 MG PO TABS
400.0000 mg | ORAL_TABLET | Freq: Once | ORAL | Status: AC | PRN
  Administered 2022-01-02: 400 mg via ORAL
  Filled 2022-01-02: qty 1

## 2022-01-02 MED ORDER — SODIUM CHLORIDE 0.9 % IV SOLN: INTRAVENOUS | Status: AC

## 2022-01-02 MED ORDER — MAGNESIUM SULFATE 2 GM/50ML IV SOLN
2.0000 g | Freq: Once | INTRAVENOUS | Status: AC
  Administered 2022-01-02: 2 g via INTRAVENOUS
  Filled 2022-01-02: qty 50

## 2022-01-02 MED ORDER — CALCIUM GLUCONATE-NACL 1-0.675 GM/50ML-% IV SOLN
1.0000 g | Freq: Once | INTRAVENOUS | Status: AC
Start: 2022-01-02 — End: 2022-01-02
  Administered 2022-01-02: 1000 mg via INTRAVENOUS
  Filled 2022-01-02: qty 50

## 2022-01-02 NOTE — Hospital Course (Addendum)
Patient is a 41 year old Caucasian female with past medical history significant for but not limited to aortic stenosis, hypothyroidism, history of Hodgkin's lymphoma, history of anxiety and depression and alcohol abuse who presents with concerns for intentional overdose.  Patient is currently IVC to and admitting physician discussed with the husband over the phone he reports that she has been drinking alcohol all day with bottled of wine and vodka and then around 1 AM she had difficulty with getting around.  He is not sure what medication she ingested but she states that she took too much Ambien and cough medication with acetaminophen in it.  Apparently found a notebook at home with a letter to her daughter and it appears that her attempts to harm himself were intentional but she denies any suicidal ideation or attempt to hurt self now.  Patient has been denied that she had any suicidal attempts in the past.  She also denies that she is intentionally trying to harm herself today and she states that she was "being stupid".  She has been under more stress recently from her mother being around for Easter and was drinking a bottle of wine in the past she drinks 2 L daily.  She states that she was taking her normal dose of Zoloft and just interacted with alcohol and states that it has been very difficult for her to arouse.  She knows that she took some Ambien and some Mucinex along with drinking.  She also reports that she may have taken some quazepam and clonidine as well.  In the ED the ED physician found an empty bottle of Mucinex nighttime that contains Benadryl and acetaminophen.  Estimated amount of acetaminophen and that was 3900 mg.  Upon her evaluation to the ED her Tylenol level was elevated to 40 and EKG showed prolonged QTc of 515.  UDS was positive for benzos and LFTs were normal.  She had no other electrolyte abnormalities and currently was admitted for overdose and was placed on IV fluid hydration.  Given  the concern for acetaminophen toxicity she was placed on NAC and Poison control was contacted they recommended continuing NAC infusion for total of 24 hours since she had elevated acetaminophen level and unclear time of ingestion.  They recommended repeating acetaminophen level 22 hours after the infusion which will be around 10 PM on 01/02/2021.  She is currently IV seed and has a sitter for monitoring and psychiatry will be contacted for suspected suicide attempt. ? ?**Interim History ?Overnight her repeat labs showed that her LFTs and INR was 1.1 as well as an acetaminophen level of less than 10.  Poison control recommended stopping N-acetylcysteine.  We will continue IV fluid hydration.  Psychiatry evaluated and cleared the patient for D/C. She will follow-up with her PCP within 1 to 2 weeks.  Prior to discharge her potassium was repleted.  ?

## 2022-01-02 NOTE — ED Notes (Signed)
Per admitting provide patient will not have any changes to anxiety medications until psychiatry has seen patient. Ibuprofen ordered for patient's headache, will administer at this time.  ?

## 2022-01-02 NOTE — Assessment & Plan Note (Addendum)
-  Suspected intentional ingestion of Ambien, Zoloft, Clonzapem, Mucinex nightime with heavy ongoing use. ETOH of 82. ?-UDS is positive for benzodiazepine.Tylenol level was elevated at 40.  ?-N-acetylcysteine bolus and infusion has been started in the ED but now stopped.  ?-Dr. Marlowe Sax discussed with poison control and they recommend continuing infusion for total of 24 hours since she has elevated Tylenol level and unclear time of ingestion. ?-Repeat Acetaminophen level was less than 10 and repeat ?-LFTs are currently normal x3 ?-Keep on continuous telemetry  ?-Currently under IVC and will have sitter for monitoring.   ?-Will need psychiatry consult for suspected suicide attempt is pending ?-Patient denies attempt and states that she is "being stupid "but husband reports a notebook at home with a goodbye letter to her daughter. ?-Psychiatry evaluated and felt that she is no danger to self and felt the letter that she had written was not a suicide note.  They cleared her from a psych perspective and recommended continuing Klonopin 0.5 mg twice daily as needed.  She is deemed medically stable from a psych perspective and IVC rescinded.  She will follow-up with her PCP and follow-up with psychiatry in outpatient setting if necessary. ?

## 2022-01-02 NOTE — Assessment & Plan Note (Addendum)
-  QTc of 514 on admission and this is now improved to 455.  She is now sinus rhythm with a rate of 83..   ?-Poison control recommends EKG every 4-6 hours.  Will obtain every 4hrs EKG overnight and now this is improved. ?-Potassium dropped to 3.2 so we will replete with p.o. potassium chloride 40 mg twice daily x2 doses ?-Magnesium level is now improved and is 2.0 ?-Continued to monitor on telemetry and she has been admitted to the progressive unit likely can be transferred to telemetry if she is not being discharged to psychiatric facility soon ?

## 2022-01-02 NOTE — Assessment & Plan Note (Addendum)
-  CK of 368.  Continuous IV normal saline fluid had stopped but will renew and repeat CK this AM  ?

## 2022-01-02 NOTE — Assessment & Plan Note (Addendum)
-  Hold Bupropion 150 mg, Sertaline 100 mg po Daily, and Zolpidem 5 mg po qHS ?-Defer to Psychiatry for further evaluation and resumption of meds but she was resumed on Clonazepam 1 mg po BIDprn Anxiety  ?-Initiate Hydroxyzine 25 mg po TIDprn for Anxiety as well  ?-Further care per Psych ?

## 2022-01-02 NOTE — ED Notes (Signed)
Patient called this RN into room stating that she has a headache and also that her BP is high and she would like to have her home medications ordered so she can take them. On call admitting provider sent secure chat by this RN at this time. ?

## 2022-01-02 NOTE — Assessment & Plan Note (Addendum)
-  Corrected Ca of 8.1 yesterday ?-Give 1 g of calcium gluconate yesterday and also had Mag Repletion ?-Ca2+ is no 8.5 ?-Continue to Monitor and Trend ?-Repeat CMP in the AM  ?

## 2022-01-02 NOTE — ED Notes (Signed)
Poison control called and updated  

## 2022-01-02 NOTE — Progress Notes (Signed)
?PROGRESS NOTE ? ? ? ?Courtney Hughes  RWE:[id number] DOB: [birth year] DOA: 01/01/2022 ?PCP: Carolee Rota, NP  ? ?Brief Narrative:  ?Patient is a 41 year old Caucasian female with past medical history significant for but not limited to aortic stenosis, hypothyroidism, history of Hodgkin's lymphoma, history of anxiety and depression and alcohol abuse who presents with concerns for intentional overdose.  Patient is currently IVC to and admitting physician discussed with the husband over the phone he reports that she has been drinking alcohol all day with bottled of wine and vodka and then around 1 AM she had difficulty with getting around.  He is not sure what medication she ingested but she states that she took too much Ambien and cough medication with acetaminophen in it.  Apparently found a notebook at home with a letter to her daughter and it appears that her attempts to harm himself were intentional but she denies any suicidal ideation or attempt to hurt self now.  Patient has been denied that she had any suicidal attempts in the past.  She also denies that she is intentionally trying to harm herself today and she states that she was "being stupid".  She has been under more stress recently from her mother being around for Easter and was drinking a bottle of wine in the past she drinks 2 L daily.  She states that she was taking her normal dose of Zoloft and just interacted with alcohol and states that it has been very difficult for her to arouse.  She knows that she took some Ambien and some Mucinex along with drinking.  She also reports that she may have taken some quazepam and clonidine as well.  In the ED the ED physician found an empty bottle of Mucinex nighttime that contains Benadryl and acetaminophen.  Estimated amount of acetaminophen and that was 3900 mg.  Upon her evaluation to the ED her Tylenol level was elevated to 40 and EKG showed prolonged QTc of 515.  UDS was positive for benzos and LFTs were  normal.  She had no other electrolyte abnormalities and currently was admitted for overdose and was placed on IV fluid hydration.  Given the concern for acetaminophen toxicity she was placed on NAC and Poison control was contacted they recommended continuing NAC infusion for total of 24 hours since she had elevated acetaminophen level and unclear time of ingestion.  They recommended repeating acetaminophen level 22 hours after the infusion which will be around 10 PM on 01/02/2021.  She is currently IV seed and has a sitter for monitoring and psychiatry will be contacted for suspected suicide attempt.  ? ?Assessment and Plan: ?* Overdose ?-Suspected intentional ingestion of Ambien, Zoloft, Clonzapem, Mucinex nightime with heavy ongoing use. ETOH of 82. ?-UDS is positive for benzodiazepine.Tylenol level was elevated at 40.  ?-N-acetylcysteine bolus and infusion has been started in the ED.  ?-Dr. Marlowe Sax discussed with poison control and they recommend continuing infusion for total of 24 hours since she has elevated Tylenol level and unclear time of ingestion. ?-Repeat acetaminophen level at 22 hours after the start of the infusion which will be at 10 PM on 4/10 ?-Repeat LFTs at 22 hours after starting infusion.  LFTs are currently normal x2 ?-Keep on continuous telemetry ?-Currently under IVC and will have sitter for monitoring.   ?-Will need psychiatry consult for suspected suicide attempt.   ?-Patient denies attempt and states that she is "being stupid "but husband reports a notebook at home with a goodbye letter to  her daughter. ?-Continue with suicide and safety precautions ? ?Alcohol abuse ?-Continue to Monitor and Trend  ?-Place on CIWA and Monitor for S/Sx of Withdrawal  ? ?Anxiety and depression ?-Hold Bupropion 150 mg, Sertaline 100 mg po Daily, and Zolpidem 5 mg po qHS ?-Defer to Psychiatry for further evaluation and resumption of meds ? ?Metabolic acidosis ?- Initially had a slight metabolic acidosis given  her ingestion and had a CO2 of 21, anion gap of 9, chloride level 108 and now repeat CMP shows that she has a CO2 of 19, anion gap of 10, chloride level 107 ?-Continue IV fluid hydration with normal saline at 75 MLS per hour for 20 hours ? ? ?Elevated CK ?CK of 368.  Continuous IV normal saline fluid ? ?Hypocalcemia ?Corrected Ca of 8.1.  ?-Give 1 g of calcium gluconate.  Also receiving repletion for magnesium. ? ?Hypomagnesemia ?-Patient's Mag level was 1.1 ?-Replete with IV Mag Sulfate 2 grams x2 ?-Continue to Monitor and Replete as Necessary ?-Repeat Mag Level in the AM ? ?Prolonged QT interval ?-QTc of 514.   ?-Poison control recommends EKG every 4-6 hours.  Will obtain every 4hrs EKG overnight. ?-K+ of 3.6 and was replete to a goal of 4 or greater. Now K+ is 4.3 ?-Magnesium 1.1.  Replete with IV 2 g of IV magnesium x2 ?-Continue to monitor on telemetry and she has been admitted to the progressive unit ? ? ?DVT prophylaxis: enoxaparin (LOVENOX) injection 40 mg Start: 01/02/22 1000 ? ?  Code Status: Full Code ?Family Communication: No family present at bedside  ? ?Disposition Plan:  ?Level of care: Progressive ?Status is: Observation ?The patient will require care spanning > 2 midnights and should be moved to inpatient because: Had an overdose and needs Psych evaluation  ?  ? ?Consultants:  ?Psychiatry  ? ?Procedures:  ?None ? ?Antimicrobials:  ?Anti-infectives (From admission, onward)  ? ? None  ? ?  ?  ?Subjective: ?Seen examined at bedside and she denies any suicidal ideation and states that she was being "stupid".  States that she was drinking heavily because she has been under stress and drinking on Easter.  Denies any nausea or vomiting.  Feels okay.  No other concerns or complaints this time. ? ?Objective: ?Vitals:  ? 01/02/22 0015 01/02/22 0600 01/02/22 0730 01/02/22 0745  ?BP: 126/82 140/85 136/88 135/81  ?Pulse: 74 67 69 67  ?Resp: 19 (!) 31 (!) 30 19  ?Temp:    97.9 ?F (36.6 ?C)  ?TempSrc:    Oral   ?SpO2: 98% 98% 97% 96%  ?Weight:      ?Height:      ? ?No intake or output data in the 24 hours ending 01/02/22 1052 ?Filed Weights  ? 01/01/22 2116  ?Weight: 59 kg  ? ?Examination: ?Physical Exam: ? ?Constitutional: Thin Caucasian female currently no acute distress appears calm ?Respiratory: Diminished to auscultation bilaterally with coarse breath sounds, no wheezing, rales, rhonchi or crackles. Normal respiratory effort and patient is not tachypenic. No accessory muscle use.  ?Cardiovascular: RRR, has a 2 out of 6 systolic murmur.  Minimal extremity edema ?Abdomen: Soft, non-tender, non-distended. Bowel sounds positive.  ?GU: Deferred. ?Musculoskeletal: No clubbing / cyanosis of digits/nails. No joint deformity upper and lower extremities.   ?Skin: No rashes, lesions, ulcers on limited skin evaluation. No induration; Warm and dry.  ?Neurologic: CN 2-12 grossly intact with no focal deficits. Romberg sign and cerebellar reflexes not assessed.  ?Psychiatric: Normal judgment and insight. Alert and oriented  x 3. Normal mood and appropriate affect.  ? ?Data Reviewed: I have personally reviewed following labs and imaging studies ? ?CBC: ?Recent Labs  ?Lab 01/01/22 ?2139 01/02/22 ?0704  ?WBC 6.0 9.1  ?NEUTROABS 2.3  --   ?HGB 12.9 12.7  ?HCT 38.0 37.1  ?MCV 93.4 92.3  ?PLT 231 180  ? ?Basic Metabolic Panel: ?Recent Labs  ?Lab 01/01/22 ?2139 01/01/22 ?2339 01/02/22 ?1324  ?NA 138  --  136  ?K 3.6  --  4.3  ?CL 108  --  107  ?CO2 21*  --  19*  ?GLUCOSE 116*  --  126*  ?BUN 10  --  8  ?CREATININE 0.86  --  0.79  ?CALCIUM 7.9*  --  8.1*  ?MG  --  1.1*  --   ? ?GFR: ?Estimated Creatinine Clearance: 86.2 mL/min (by C-G formula based on SCr of 0.79 mg/dL). ?Liver Function Tests: ?Recent Labs  ?Lab 01/01/22 ?2139 01/02/22 ?0704  ?AST 36 34  ?ALT 26 27  ?ALKPHOS 79 75  ?BILITOT 0.6 0.8  ?PROT 6.3* 6.2*  ?ALBUMIN 3.2* 3.0*  ? ?No results for input(s): LIPASE, AMYLASE in the last 168 hours. ?No results for input(s): AMMONIA in the  last 168 hours. ?Coagulation Profile: ?No results for input(s): INR, PROTIME in the last 168 hours. ?Cardiac Enzymes: ?Recent Labs  ?Lab 01/01/22 ?2339  ?CKTOTAL 368*  ? ?BNP (last 3 results) ?No results for in

## 2022-01-02 NOTE — ED Notes (Signed)
IVC paperwork completed, papers with Kenney Houseman, faxed to [facility] ?

## 2022-01-02 NOTE — H&P (Signed)
?History and Physical  ? ? ?Patient: Courtney Hughes HYQ:[id number] DOB: 1980-10-27 ?DOA: 01/01/2022 ?DOS: the patient was seen and examined on 01/02/2022 ?PCP: Carolee Rota, NP  ?Patient coming from: Home ? ?Chief Complaint:  ?Chief Complaint  ?Patient presents with  ? Ingestion  ?  BIB by ems, per husband he called ems due to not being able to get pt out of bed. Patient reports taking ambien, clonidine and alcohol tonight in an attempt to hard herself. Unknown amount taken. Denies HI. Reports this is her first attempt.   ? ?HPI: Courtney Hughes is a 41 y.o. female with medical history significant of aortic stenosis, hypothyroidism, Hodgkin's lymphoma, anxiety and depression, alcohol abuse who presents with concerns of intentional overdose. ? ?Discussed with husband over the phone and he reports that she has been drinking alcohol all day with bottles of wine and vodka and then around 1 this afternoon he was having difficulty with getting around.  He was not sure what medication she had ingested.  He also found a notebook at home with a letter to her daughter that appears her attempts to harm herself today was intentional.  He denies past history of suicidal attempts. ?Patient denies that she was intentionally trying to harm herself today.  Reports that she has been under more stress from her mother being around for Easter.  Reports drinking about a bottle of wine today in the past 2 L daily.  States she was taking normal dose of Zoloft and thinks it just interacted with her alcohol.  Denies taking any other medications at first but then later when asked about specific meds she says that she took Ambien in some Mucinex. ? ?Per ED report, she might of taken clonazepam and clonidine as well.  ED physician also found empty bottle of Mucinex nighttime that contain Benadryl and Tylenol.  Estimated about 3900 mg of Tylenol.  Tylenol level was elevated to 40.  EKG show slightly prolonged QTc 515.  UDS was positive for  benzos.  LFTs were normal.  No other electrolyte abnormalities.  CBC unremarkable. ? ?review of Systems: As mentioned in the history of present illness. All other systems reviewed and are negative. ?Past Medical History:  ?Diagnosis Date  ? Anxiety   ? Depression   ? Lymphoma ([place])   ? as teenager  ? ?Past Surgical History:  ?Procedure Laterality Date  ? DILATION AND EVACUATION N/A 01/08/2015  ? Procedure: DILATATION AND EVACUATION;  Surgeon: Vanessa Kick, MD;  Location: [facility];  Service: Gynecology;  Laterality: N/A;  ? HERNIA REPAIR    ? LYMPH NODE BIOPSY    ? ORIF HUMERUS FRACTURE Right 07/28/2021  ? Procedure: OPEN REDUCTION INTERNAL FIXATION (ORIF) DISTAL HUMERUS FRACTURE;  Surgeon: Hiram Gash, MD;  Location: [facility];  Service: Orthopedics;  Laterality: Right;  ? ?Social History:  reports that she quit smoking about 11 years ago. Her smoking use included cigarettes. She has never used smokeless tobacco. She reports current alcohol use. She reports that she does not use drugs. ? ?No Known Allergies ? ?No family history on file. ? ?Prior to Admission medications   ?Medication Sig Start Date End Date Taking? Authorizing Provider  ?buPROPion (WELLBUTRIN SR) 150 MG 12 hr tablet Take 150 mg by mouth daily.     [provider]  ?sertraline (ZOLOFT) 100 MG tablet Take 100 mg by mouth daily.    [provider]  ?zolpidem (AMBIEN) 10 MG tablet Take 5 mg by  mouth at bedtime.    [provider]  ? ? ?Physical Exam: ?Vitals:  ? 01/01/22 2245 01/01/22 2300 01/01/22 2315 01/01/22 2330  ?BP: 119/81 119/77 117/76 128/79  ?Pulse: 66 66 68 79  ?Resp:    (!) 23  ?Temp:      ?TempSrc:      ?SpO2: 96% 95% 97% 95%  ?Weight:      ?Height:      ?Constitutional: NAD, calm, drowsy appearing female laying in bed  ?Eyes: lids and conjunctivae normal ?ENMT: Mucous membranes are dry with dry tongue and difficulty with speech due to dryness ?Neck: normal, supple ?Respiratory: clear to auscultation  bilaterally, no wheezing, no crackles. Normal respiratory effort. No accessory muscle use.  ?Cardiovascular: Regular rate and rhythm, no murmurs / rubs / gallops. No extremity edema.  ?Abdomen:soft, non-distended, no tenderness,  Bowel sounds positive.  ?Musculoskeletal: no clubbing / cyanosis. No joint deformity upper and lower extremities. Normal muscle tone.  ?Skin: no rashes, lesions, ulcers. No induration ?Neurologic: CN 2-12 grossly intact.  Strength 5/5 in all 4. Slow speech. ?Psychiatric: Alert and oriented x 3. Flat affect and drowsy ?Data Reviewed: ? ?See HPI ? ?Assessment and Plan: ?* Overdose ?Suspected intentional ingestion of Ambien, Zoloft, Clonzapem, Mucinex nightime with heavy ongoing use. ETOH of 82. UDS is positive for benzodiazepine.Tylenol level was elevated at 40.  ?-N-acetylcysteine bolus and infusion has been started in the ED.  I discussed with poison control and they recommend continuing infusion for total of 24 hours since she has elevated Tylenol level and unclear time of ingestion. ?-Repeat Tylenol level at 22 hours after the start of the infusion which will be at 10 PM on 4/10 ?-Repeat LFTs at 22 hours after starting infusion.  LFTs are currently normal. ?-Keep on continuous telemetry ?-Currently under IVC and will have sitter for monitoring.  Will need psychiatry consult for suspected suicide attempt.  Patient denies attempt but husband reports a notebook at home with a goodbye letter to her daughter. ? ?Elevated CK ?CK of 368.  Continuous IV normal saline fluid ? ?Hypocalcemia ?Corrected Ca of 8.1.  ?-Give 1 g of calcium gluconate.  Also receiving repletion for magnesium. ? ?Prolonged QT interval ?QTc of 514.  Poison control recommends EKG every 4-6 hours.  Will obtain every 4hrs EKG overnight. ?-K of 3.6.  Will replete to a goal of 4. ?-Magnesium 1.1.  Replete with IV 2 g of IV magnesium ? ? ? ? ? Advance Care Planning:   Code Status: Full Code  ? ?Consults: needs psychiatry  consult ? ?Family Communication: Discussed with husband over the phone ? ?Severity of Illness: ?The appropriate patient status for this patient is OBSERVATION. Observation status is judged to be reasonable and necessary in order to provide the required intensity of service to ensure the patient's safety. The patient's presenting symptoms, physical exam findings, and initial radiographic and laboratory data in the context of their medical condition is felt to place them at decreased risk for further clinical deterioration. Furthermore, it is anticipated that the patient will be medically stable for discharge from the hospital within 2 midnights of admission.  ? ?Author: Orene Desanctis, DO ?01/02/2022 12:36 AM ? ?For on call review [email or web address].  ?

## 2022-01-02 NOTE — Assessment & Plan Note (Addendum)
-  Continue to Monitor and Trend  ?-Place on CIWA and Monitor for S/Sx of Withdrawal  ?-Currently no Signs of Withdrawal but is very Anxious and Tearful this AM ?

## 2022-01-02 NOTE — ED Notes (Signed)
Offered Pt a shower, she declined at this time.  ?

## 2022-01-02 NOTE — Progress Notes (Addendum)
MEDICATION RELATED CONSULT NOTE  ? ? ?Pharmacy Consult for IV Acetylcysteine ?Indication: Intentional polypharmacy overdose ? ?No Known Allergies ? ?Patient Measurements: ?Height: '5\' 6"'$  (167.6 cm) ?Weight: 59 kg (130 lb) ?IBW/kg (Calculated) : 59.3 ? ?Vital Signs: ?Temp: 98 ?F (36.7 ?C) (04/09 2116) ?Temp Source: Oral (04/09 2116) ?BP: 128/79 (04/09 2330) ?Pulse Rate: 79 (04/09 2330) ?Intake/Output from previous day: ?No intake/output data recorded. ?Intake/Output from this shift: ?No intake/output data recorded. ? ?Labs: ?Recent Labs  ?  01/01/22 ?2139 01/01/22 ?2339  ?WBC 6.0  --   ?HGB 12.9  --   ?HCT 38.0  --   ?PLT 231  --   ?CREATININE 0.86  --   ?MG  --  1.1*  ?ALBUMIN 3.2*  --   ?PROT 6.3*  --   ?AST 36  --   ?ALT 26  --   ?ALKPHOS 79  --   ?BILITOT 0.6  --   ? ?Estimated Creatinine Clearance: 80.2 mL/min (by C-G formula based on SCr of 0.86 mg/dL).  ? ?Microbiology: ?No results found for this or any previous visit (from the past 720 hour(s)). ? ? ?Assessment: ?41 y/o F with intentional polypharmacy overdose including clonazepam, clonidine, and liquid medication containing APAP among other things. Exact timing unclear. APAP level 40. AST/ALT WNL. No INR. EDP spoke with poison control and they recommend minimum 24 hours Acetylcysteine.  ? ? ?Plan:  ?Acetylcysteine IV 150 mg/kg load ?Acetylcysteine IV 15 mg/kg/hr infusion  ?F/U APAP level, LFTs, INR ?F/U poison control recommendations  ? ?Narda Bonds, PharmD, BCPS ?Clinical Pharmacist ?Phone: [phone] ? ? ? ?

## 2022-01-02 NOTE — Assessment & Plan Note (Addendum)
-  Patient's Mag level was 1.1 and improved to 2.0 ?-Continue to Monitor and Replete as Necessary ?-Repeat Mag Level in the AM ?

## 2022-01-02 NOTE — Assessment & Plan Note (Addendum)
-   Initially had a slight metabolic acidosis given her ingestion and had a CO2 of 21, anion gap of 9, chloride level 108 and now repeat CMP shows that she has a CO2 of 19, anion gap of 10, chloride level 107 yesterday ?-Today her CO2 is 20, anion gap is 10, chloride level is 109 ?-Continued IV fluid hydration with normal saline at 75 MLS per hour for 20 hours but this is expired but will renew while she is hospitalized ? ?

## 2022-01-03 DIAGNOSIS — T50901A Poisoning by unspecified drugs, medicaments and biological substances, accidental (unintentional), initial encounter: Secondary | ICD-10-CM

## 2022-01-03 LAB — COMPREHENSIVE METABOLIC PANEL
ALT: 25 U/L (ref 0–44)
AST: 38 U/L (ref 15–41)
Albumin: 3.4 g/dL — ABNORMAL LOW (ref 3.5–5.0)
Alkaline Phosphatase: 83 U/L (ref 38–126)
Anion gap: 10 (ref 5–15)
BUN: <5 mg/dL — ABNORMAL LOW (ref 6–20)
CO2: 20 mmol/L — ABNORMAL LOW (ref 22–32)
Calcium: 8.5 mg/dL — ABNORMAL LOW (ref 8.9–10.3)
Chloride: 109 mmol/L (ref 98–111)
Creatinine, Ser: 0.6 mg/dL (ref 0.44–1.00)
GFR, Estimated: >60 mL/min (ref >60)
Glucose, Bld: 118 mg/dL — ABNORMAL HIGH (ref 70–99)
Potassium: 3.2 mmol/L — ABNORMAL LOW (ref 3.5–5.1)
Sodium: 139 mmol/L (ref 135–145)
Total Bilirubin: 1.1 mg/dL (ref 0.3–1.2)
Total Protein: 6.6 g/dL (ref 6.5–8.1)

## 2022-01-03 LAB — CBC WITH DIFFERENTIAL/PLATELET
Abs Immature Granulocytes: 0.01 10*3/uL (ref 0.00–0.07)
Basophils Absolute: 0.1 10*3/uL (ref 0.0–0.1)
Basophils Relative: 1 %
Eosinophils Absolute: 0.9 10*3/uL — ABNORMAL HIGH (ref 0.0–0.5)
Eosinophils Relative: 18 %
HCT: 36.9 % (ref 36.0–46.0)
Hemoglobin: 13.2 g/dL (ref 12.0–15.0)
Immature Granulocytes: 0 %
Lymphocytes Relative: 35 %
Lymphs Abs: 1.8 10*3/uL (ref 0.7–4.0)
MCH: 31.8 pg (ref 26.0–34.0)
MCHC: 35.8 g/dL (ref 30.0–36.0)
MCV: 88.9 fL (ref 80.0–100.0)
Monocytes Absolute: 0.3 10*3/uL (ref 0.1–1.0)
Monocytes Relative: 6 %
Neutro Abs: 2 10*3/uL (ref 1.7–7.7)
Neutrophils Relative %: 40 %
Platelets: 160 10*3/uL (ref 150–400)
RBC: 4.15 MIL/uL (ref 3.87–5.11)
RDW: 11.7 % (ref 11.5–15.5)
WBC: 5 10*3/uL (ref 4.0–10.5)
nRBC: 0 % (ref 0.0–0.2)

## 2022-01-03 LAB — PHOSPHORUS: Phosphorus: 2.5 mg/dL (ref 2.5–4.6)

## 2022-01-03 LAB — MAGNESIUM: Magnesium: 2 mg/dL (ref 1.7–2.4)

## 2022-01-03 MED ORDER — CLONAZEPAM 0.5 MG PO TABS
0.5000 mg | ORAL_TABLET | Freq: Two times a day (BID) | ORAL | Status: DC
  Administered 2022-01-03: 0.5 mg via ORAL
  Filled 2022-01-03: qty 1

## 2022-01-03 MED ORDER — CLONAZEPAM 0.5 MG PO TABS: 0.5000 mg | ORAL_TABLET | Freq: Two times a day (BID) | ORAL | Status: AC | PRN

## 2022-01-03 MED ORDER — POTASSIUM CHLORIDE CRYS ER 20 MEQ PO TBCR
40.0000 meq | EXTENDED_RELEASE_TABLET | Freq: Two times a day (BID) | ORAL | Status: DC
  Administered 2022-01-03: 40 meq via ORAL
  Filled 2022-01-03: qty 2

## 2022-01-03 MED ORDER — SODIUM CHLORIDE 0.9 % IV SOLN
INTRAVENOUS | Status: DC
Start: 2022-01-03 — End: 2022-01-03

## 2022-01-03 MED ORDER — HYDROXYZINE HCL 25 MG PO TABS
25.0000 mg | ORAL_TABLET | Freq: Three times a day (TID) | ORAL | Status: DC | PRN
  Administered 2022-01-03: 25 mg via ORAL
  Filled 2022-01-03: qty 1

## 2022-01-03 NOTE — ED Notes (Signed)
Patient requesting something for her anxiety at this time. Will administer ordered PRN medication for patient.  ?

## 2022-01-03 NOTE — ED Notes (Signed)
Pt ao x 4, NAD, denies SI/HI.  ?

## 2022-01-03 NOTE — ED Notes (Signed)
Breakfast order placed ?

## 2022-01-03 NOTE — ED Notes (Signed)
Pt called husband re: discharge. AVS given to pt, verbalized understanding. Pt appears very happy that she's going home. ?
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
## 2022-01-03 NOTE — Progress Notes (Signed)
Overnight progress note ? ?Repeat labs showing normal LFT's, INR 1.1, and acetaminophen level <10.  Notified by RN that poison control recommending stopping acetylcysteine. ?

## 2022-01-03 NOTE — ED Notes (Signed)
Per primary and Psych, pt is being dcd. ?

## 2022-01-03 NOTE — Progress Notes (Signed)
?PROGRESS NOTE ? ? ? ?Courtney Hughes  JGG:[id number] DOB: 1980-09-28 DOA: 01/01/2022 ?PCP: Carolee Rota, NP  ? ?Brief Narrative:  ?Patient is a 41 year old Caucasian female with past medical history significant for but not limited to aortic stenosis, hypothyroidism, history of Hodgkin's lymphoma, history of anxiety and depression and alcohol abuse who presents with concerns for intentional overdose.  Patient is currently IVC to and admitting physician discussed with the husband over the phone he reports that she has been drinking alcohol all day with bottled of wine and vodka and then around 1 AM she had difficulty with getting around.  He is not sure what medication she ingested but she states that she took too much Ambien and cough medication with acetaminophen in it.  Apparently found a notebook at home with a letter to her daughter and it appears that her attempts to harm himself were intentional but she denies any suicidal ideation or attempt to hurt self now.  Patient has been denied that she had any suicidal attempts in the past.  She also denies that she is intentionally trying to harm herself today and she states that she was "being stupid".  She has been under more stress recently from her mother being around for Easter and was drinking a bottle of wine in the past she drinks 2 L daily.  She states that she was taking her normal dose of Zoloft and just interacted with alcohol and states that it has been very difficult for her to arouse.  She knows that she took some Ambien and some Mucinex along with drinking.  She also reports that she may have taken some quazepam and clonidine as well.  In the ED the ED physician found an empty bottle of Mucinex nighttime that contains Benadryl and acetaminophen.  Estimated amount of acetaminophen and that was 3900 mg.  Upon her evaluation to the ED her Tylenol level was elevated to 40 and EKG showed prolonged QTc of 515.  UDS was positive for benzos and LFTs were  normal.  She had no other electrolyte abnormalities and currently was admitted for overdose and was placed on IV fluid hydration.  Given the concern for acetaminophen toxicity she was placed on NAC and Poison control was contacted they recommended continuing NAC infusion for total of 24 hours since she had elevated acetaminophen level and unclear time of ingestion.  They recommended repeating acetaminophen level 22 hours after the infusion which will be around 10 PM on 01/02/2021.  She is currently IV seed and has a sitter for monitoring and psychiatry will be contacted for suspected suicide attempt. ? ?**Interim History ?Overnight her repeat labs showed that her LFTs and INR was 1.1 as well as an acetaminophen level of less than 10.  Poison control recommended stopping N-acetylcysteine.  We will continue IV fluid hydration.  Psychiatry still pending to evaluate the patient and patient feels anxious this morning and was tearful.  ? ? ?Assessment and Plan: ?* Overdose ?-Suspected intentional ingestion of Ambien, Zoloft, Clonzapem, Mucinex nightime with heavy ongoing use. ETOH of 82. ?-UDS is positive for benzodiazepine.Tylenol level was elevated at 40.  ?-N-acetylcysteine bolus and infusion has been started in the ED but now stopped.  ?-Dr. Marlowe Sax discussed with poison control and they recommend continuing infusion for total of 24 hours since she has elevated Tylenol level and unclear time of ingestion. ?-Repeat Acetaminophen level was less than 10 and repeat ?-LFTs are currently normal x3 ?-Keep on continuous telemetry  ?-Currently under IVC  and will have sitter for monitoring.   ?-Will need psychiatry consult for suspected suicide attempt is pending ?-Patient denies attempt and states that she is "being stupid "but husband reports a notebook at home with a goodbye letter to her daughter. ?-Continue with suicide and safety precautions ? ?Alcohol abuse ?-Continue to Monitor and Trend  ?-Place on CIWA and Monitor for  S/Sx of Withdrawal  ?-Currently no Signs of Withdrawal but is very Anxious and Tearful this AM ? ?Anxiety and depression ?-Hold Bupropion 150 mg, Sertaline 100 mg po Daily, and Zolpidem 5 mg po qHS ?-Defer to Psychiatry for further evaluation and resumption of meds but she was resumed on Clonazepam 1 mg po BIDprn Anxiety  ?-Initiate Hydroxyzine 25 mg po TIDprn for Anxiety as well  ?-Further care per Psych ? ?Metabolic acidosis ?- Initially had a slight metabolic acidosis given her ingestion and had a CO2 of 21, anion gap of 9, chloride level 108 and now repeat CMP shows that she has a CO2 of 19, anion gap of 10, chloride level 107 yesterday ?-Today her CO2 is 20, anion gap is 10, chloride level is 109 ?-Continued IV fluid hydration with normal saline at 75 MLS per hour for 20 hours but this is expired but will renew while she is hospitalized ? ? ?Elevated CK ?-CK of 368.  Continuous IV normal saline fluid had stopped but will renew and repeat CK this AM  ? ?Hypocalcemia ?-Corrected Ca of 8.1 yesterday ?-Give 1 g of calcium gluconate yesterday and also had Mag Repletion ?-Ca2+ is no 8.5 ?-Continue to Monitor and Trend ?-Repeat CMP in the AM  ? ?Hypomagnesemia ?-Patient's Mag level was 1.1 and improved to 2.0 ?-Continue to Monitor and Replete as Necessary ?-Repeat Mag Level in the AM ? ?Prolonged QT interval ?-QTc of 514 on admission and this is now improved to 455.  She is now sinus rhythm with a rate of 83..   ?-Poison control recommends EKG every 4-6 hours.  Will obtain every 4hrs EKG overnight and now this is improved. ?-Potassium dropped to 3.2 so we will replete with p.o. potassium chloride 40 mg twice daily x2 doses ?-Magnesium level is now improved and is 2.0 ?-Continued to monitor on telemetry and she has been admitted to the progressive unit likely can be transferred to telemetry if she is not being discharged to psychiatric facility soon ? ?DVT prophylaxis: enoxaparin (LOVENOX) injection 40 mg Start:  01/02/22 1000 ? ?  Code Status: Full Code ?Family Communication: No family present at bedside  ? ?Disposition Plan:  ?Level of care: Progressive ?Status is: Inpatient ?Remains inpatient appropriate because: Needs Pscyh Evaluation and Clearance  ? ?Consultants:  ?Psychiatry ? ?Procedures:  ?None ? ?Antimicrobials:  ?Anti-infectives (From admission, onward)  ? ? None  ? ?  ?  ?Subjective: ?Seen and examined at bedside this morning she was very tearful and anxious.  She denies any chest pain or shortness of breath.  Denies any tremors.  No nausea or vomiting.  No other concerns or complaints at this time. ? ?Objective: ?Vitals:  ? 01/03/22 0200 01/03/22 0300 01/03/22 0503 01/03/22 0600  ?BP: (!) 149/85 136/78 (!) 147/102 (!) 155/100  ?Pulse: 74 78 92 84  ?Resp: '15 16 19 '$ (!) 23  ?Temp:      ?TempSrc:      ?SpO2: 98% 100% 100% 97%  ?Weight:      ?Height:      ? ? ?Intake/Output Summary (Last 24 hours) at 01/03/2022 0956 ?Last  data filed at 01/02/2022 1801 ?Gross per 24 hour  ?Intake 960 ml  ?Output --  ?Net 960 ml  ? ?Filed Weights  ? 01/01/22 2116  ?Weight: 59 kg  ? ?Examination: ?Physical Exam: ? ?Constitutional: WN/WD Caucasian female in who appears anxious and is tearful ?Respiratory: Mildly diminished to auscultation bilaterally, no wheezing, rales, rhonchi or crackles. Normal respiratory effort and patient is not tachypenic. No accessory muscle use.  ?Cardiovascular: Tachycardic Rate but regular rhythm, no murmurs / rubs / gallops. S1 and S2 auscultated. No extremity edema.  ?Abdomen: Soft, non-tender, non-distended. Bowel sounds positive.  ?GU: Deferred. ?Musculoskeletal: No clubbing / cyanosis of digits/nails. No joint deformity upper and lower extremities. ?Skin: No rashes, lesions, ulcers on a limited skin evaluation. No induration; Warm and dry.  ?Neurologic: CN 2-12 grossly intact with no focal deficits. Romberg sign cerebellar reflexes not assessed.  ?Psychiatric: Normal judgment and insight. Alert and  oriented x 3. Very anxious and tearful ? ?Data Reviewed: I have personally reviewed following labs and imaging studies ? ?CBC: ?Recent Labs  ?Lab 01/01/22 ?2139 01/02/22 ?1219 01/03/22 ?0311  ?WBC 6.0 9.1 5.0  ?NEUTROAB

## 2022-01-03 NOTE — Discharge Summary (Signed)
?Physician Discharge Summary ?  ?Patient: Courtney Hughes MRN: [id number] DOB: [birth year]  ?Admit date:     01/01/2022  ?Discharge date: 01/03/22  ?Discharge Physician: Raiford Noble, DO  ? ?PCP: Carolee Rota, NP  ? ?Recommendations at discharge:  ? ? Follow up with PCP within 1-2 weeks and repeat CBC, CMP, Mag, Phos ?Follow up with Psychiatry in the outpatient setting  ? ?Discharge Diagnoses: ?Principal Problem: ?  Overdose ?Active Problems: ?  Prolonged QT interval ?  Hypomagnesemia ?  Hypocalcemia ?  Elevated CK ?  Metabolic acidosis ?  Anxiety and depression ?  Alcohol abuse ? ?Resolved Problems: ?  * No resolved hospital problems. * ? ?Hospital Course: ?Patient is a 41 year old Caucasian female with past medical history significant for but not limited to aortic stenosis, hypothyroidism, history of Hodgkin's lymphoma, history of anxiety and depression and alcohol abuse who presents with concerns for intentional overdose.  Patient is currently IVC to and admitting physician discussed with the husband over the phone he reports that she has been drinking alcohol all day with bottled of wine and vodka and then around 1 AM she had difficulty with getting around.  He is not sure what medication she ingested but she states that she took too much Ambien and cough medication with acetaminophen in it.  Apparently found a notebook at home with a letter to her daughter and it appears that her attempts to harm himself were intentional but she denies any suicidal ideation or attempt to hurt self now.  Patient has been denied that she had any suicidal attempts in the past.  She also denies that she is intentionally trying to harm herself today and she states that she was "being stupid".  She has been under more stress recently from her mother being around for Easter and was drinking a bottle of wine in the past she drinks 2 L daily.  She states that she was taking her normal dose of Zoloft and just interacted with alcohol and  states that it has been very difficult for her to arouse.  She knows that she took some Ambien and some Mucinex along with drinking.  She also reports that she may have taken some quazepam and clonidine as well.  In the ED the ED physician found an empty bottle of Mucinex nighttime that contains Benadryl and acetaminophen.  Estimated amount of acetaminophen and that was 3900 mg.  Upon her evaluation to the ED her Tylenol level was elevated to 40 and EKG showed prolonged QTc of 515.  UDS was positive for benzos and LFTs were normal.  She had no other electrolyte abnormalities and currently was admitted for overdose and was placed on IV fluid hydration.  Given the concern for acetaminophen toxicity she was placed on NAC and Poison control was contacted they recommended continuing NAC infusion for total of 24 hours since she had elevated acetaminophen level and unclear time of ingestion.  They recommended repeating acetaminophen level 22 hours after the infusion which will be around 10 PM on 01/02/2021.  She is currently IV seed and has a sitter for monitoring and psychiatry will be contacted for suspected suicide attempt. ? ?**Interim History ?Overnight her repeat labs showed that her LFTs and INR was 1.1 as well as an acetaminophen level of less than 10.  Poison control recommended stopping N-acetylcysteine.  We will continue IV fluid hydration.  Psychiatry evaluated and cleared the patient for D/C. She will follow-up with her PCP within 1 to 2  weeks.  Prior to discharge her potassium was repleted.  ? ?Assessment and Plan: ?* Overdose ?-Suspected intentional ingestion of Ambien, Zoloft, Clonzapem, Mucinex nightime with heavy ongoing use. ETOH of 82. ?-UDS is positive for benzodiazepine.Tylenol level was elevated at 40.  ?-N-acetylcysteine bolus and infusion has been started in the ED but now stopped.  ?-Dr. Marlowe Sax discussed with poison control and they recommend continuing infusion for total of 24 hours since she has  elevated Tylenol level and unclear time of ingestion. ?-Repeat Acetaminophen level was less than 10 and repeat ?-LFTs are currently normal x3 ?-Keep on continuous telemetry  ?-Currently under IVC and will have sitter for monitoring.   ?-Will need psychiatry consult for suspected suicide attempt is pending ?-Patient denies attempt and states that she is "being stupid "but husband reports a notebook at home with a goodbye letter to her daughter. ?-Psychiatry evaluated and felt that she is no danger to self and felt the letter that she had written was not a suicide note.  They cleared her from a psych perspective and recommended continuing Klonopin 0.5 mg twice daily as needed.  She is deemed medically stable from a psych perspective and IVC rescinded.  She will follow-up with her PCP and follow-up with psychiatry in outpatient setting if necessary. ? ?Alcohol abuse ?-Continue to Monitor and Trend  ?-Place on CIWA and Monitor for S/Sx of Withdrawal  ?-Currently no Signs of Withdrawal but is very Anxious and Tearful this AM ? ?Anxiety and depression ?-Hold Bupropion 150 mg, Sertaline 100 mg po Daily, and Zolpidem 5 mg po qHS ?-Defer to Psychiatry for further evaluation and resumption of meds but she was resumed on Clonazepam 1 mg po BIDprn Anxiety  ?-Initiate Hydroxyzine 25 mg po TIDprn for Anxiety as well  ?-Further care per Psych ? ?Metabolic acidosis ?- Initially had a slight metabolic acidosis given her ingestion and had a CO2 of 21, anion gap of 9, chloride level 108 and now repeat CMP shows that she has a CO2 of 19, anion gap of 10, chloride level 107 yesterday ?-Today her CO2 is 20, anion gap is 10, chloride level is 109 ?-Continued IV fluid hydration with normal saline at 75 MLS per hour for 20 hours but this is expired but will renew while she is hospitalized ? ? ?Elevated CK ?-CK of 368.  Continuous IV normal saline fluid had stopped but will renew and repeat CK this AM  ? ?Hypocalcemia ?-Corrected Ca of 8.1  yesterday ?-Give 1 g of calcium gluconate yesterday and also had Mag Repletion ?-Ca2+ is no 8.5 ?-Continue to Monitor and Trend ?-Repeat CMP in the AM  ? ?Hypomagnesemia ?-Patient's Mag level was 1.1 and improved to 2.0 ?-Continue to Monitor and Replete as Necessary ?-Repeat Mag Level in the AM ? ?Prolonged QT interval ?-QTc of 514 on admission and this is now improved to 455.  She is now sinus rhythm with a rate of 83..   ?-Poison control recommends EKG every 4-6 hours.  Will obtain every 4hrs EKG overnight and now this is improved. ?-Potassium dropped to 3.2 so we will replete with p.o. potassium chloride 40 mg twice daily x2 doses ?-Magnesium level is now improved and is 2.0 ?-Continued to monitor on telemetry and she has been admitted to the progressive unit likely can be transferred to telemetry if she is not being discharged to psychiatric facility soon ? ?Consultants: Psych ?Procedures performed: None  ?Disposition: Home ?Diet recommendation:  ?Cardiac diet ?DISCHARGE MEDICATION: ?Allergies as of 01/03/2022   ?  No Known Allergies ?  ? ?  ?Medication List  ?  ? ?STOP taking these medications   ? ?guaiFENesin 600 MG 12 hr tablet ?Commonly known as: MUCINEX ?  ?zolpidem 10 MG tablet ?Commonly known as: AMBIEN ?  ? ?  ? ?TAKE these medications   ? ?amLODipine 10 MG tablet ?Commonly known as: NORVASC ?Take 10 mg by mouth every evening. ?  ?clonazePAM 0.5 MG tablet ?Commonly known as: KLONOPIN ?Take 1 tablet (0.5 mg total) by mouth 2 (two) times daily as needed for anxiety. ?What changed:  ?medication strength ?how much to take ?  ?cloNIDine 0.1 MG tablet ?Commonly known as: CATAPRES ?Take 0.1 mg by mouth 4 (four) times daily as needed (high blood pressure). ?  ?ibuprofen 200 MG tablet ?Commonly known as: ADVIL ?Take 200 mg by mouth every 6 (six) hours as needed for moderate pain. ?  ?Norgestimate-Ethinyl Estradiol Triphasic 0.18/0.215/0.25 MG-35 MCG tablet ?Take 1 tablet by mouth daily. ?  ? ?  ? ? ?Discharge  Exam: ?Filed Weights  ? 01/01/22 2116  ?Weight: 59 kg  ? ?Vitals:  ? 01/03/22 1054 01/03/22 1323  ?BP: (!) 147/101 137/89  ?Pulse: 98 (!) 101  ?Resp: 17 18  ?Temp: 97.6 ?F (36.4 ?C) 97.9 ?F (36.6 ?C)  ?SpO2:

## 2022-01-03 NOTE — ED Notes (Signed)
Patient made 2nd phone call. ?

## 2023-01-28 ENCOUNTER — Other Ambulatory Visit: Payer: Self-pay

## 2023-01-28 ENCOUNTER — Encounter (HOSPITAL_COMMUNITY): Payer: Self-pay

## 2023-01-28 ENCOUNTER — Emergency Department (HOSPITAL_COMMUNITY)
Admission: EM | Admit: 2023-01-28 | Discharge: 2023-01-29 | Disposition: A | Discharge status: Home / Self Care | Payer: No Typology Code available for payment source | Attending: Emergency Medicine | Admitting: Emergency Medicine

## 2023-01-28 DIAGNOSIS — R4182 Altered mental status, unspecified: Secondary | ICD-10-CM | POA: Insufficient documentation

## 2023-01-28 DIAGNOSIS — Z79899 Other long term (current) drug therapy: Secondary | ICD-10-CM | POA: Insufficient documentation

## 2023-01-28 DIAGNOSIS — T50901A Poisoning by unspecified drugs, medicaments and biological substances, accidental (unintentional), initial encounter: Secondary | ICD-10-CM | POA: Diagnosis present

## 2023-01-28 DIAGNOSIS — R410 Disorientation, unspecified: Secondary | ICD-10-CM | POA: Diagnosis not present

## 2023-01-28 DIAGNOSIS — F1090 Alcohol use, unspecified, uncomplicated: Secondary | ICD-10-CM

## 2023-01-28 DIAGNOSIS — R5383 Other fatigue: Secondary | ICD-10-CM | POA: Diagnosis not present

## 2023-01-28 LAB — CBC WITH DIFFERENTIAL/PLATELET
Abs Immature Granulocytes: 0.01 10*3/uL (ref 0.00–0.07)
Basophils Absolute: 0 10*3/uL (ref 0.0–0.1)
Basophils Relative: 0 %
Eosinophils Absolute: 0.3 10*3/uL (ref 0.0–0.5)
Eosinophils Relative: 6 %
HCT: 39.7 % (ref 36.0–46.0)
Hemoglobin: 14.1 g/dL (ref 12.0–15.0)
Immature Granulocytes: 0 %
Lymphocytes Relative: 36 %
Lymphs Abs: 2 10*3/uL (ref 0.7–4.0)
MCH: 34.2 pg — ABNORMAL HIGH (ref 26.0–34.0)
MCHC: 35.5 g/dL (ref 30.0–36.0)
MCV: 96.4 fL (ref 80.0–100.0)
Monocytes Absolute: 0.2 10*3/uL (ref 0.1–1.0)
Monocytes Relative: 4 %
Neutro Abs: 3 10*3/uL (ref 1.7–7.7)
Neutrophils Relative %: 54 %
Platelets: 143 10*3/uL — ABNORMAL LOW (ref 150–400)
RBC: 4.12 MIL/uL (ref 3.87–5.11)
RDW: 12.5 % (ref 11.5–15.5)
WBC: 5.5 10*3/uL (ref 4.0–10.5)
nRBC: 0 % (ref 0.0–0.2)

## 2023-01-28 LAB — COMPREHENSIVE METABOLIC PANEL
ALT: 63 U/L — ABNORMAL HIGH (ref 0–44)
AST: 108 U/L — ABNORMAL HIGH (ref 15–41)
Albumin: 3.6 g/dL (ref 3.5–5.0)
Alkaline Phosphatase: 101 U/L (ref 38–126)
Anion gap: 11 (ref 5–15)
BUN: 7 mg/dL (ref 6–20)
CO2: 24 mmol/L (ref 22–32)
Calcium: 8 mg/dL — ABNORMAL LOW (ref 8.9–10.3)
Chloride: 97 mmol/L — ABNORMAL LOW (ref 98–111)
Creatinine, Ser: 0.79 mg/dL (ref 0.44–1.00)
GFR, Estimated: >60 mL/min (ref >60)
Glucose, Bld: 121 mg/dL — ABNORMAL HIGH (ref 70–99)
Potassium: 3.9 mmol/L (ref 3.5–5.1)
Sodium: 132 mmol/L — ABNORMAL LOW (ref 135–145)
Total Bilirubin: 0.7 mg/dL (ref 0.3–1.2)
Total Protein: 7.8 g/dL (ref 6.5–8.1)

## 2023-01-28 LAB — SALICYLATE LEVEL: Salicylate Lvl: <7 mg/dL — ABNORMAL LOW (ref 7.0–30.0)

## 2023-01-28 LAB — ETHANOL: Alcohol, Ethyl (B): 171 mg/dL — ABNORMAL HIGH (ref <10)

## 2023-01-28 LAB — ACETAMINOPHEN LEVEL: Acetaminophen (Tylenol), Serum: <10 ug/mL — ABNORMAL LOW (ref 10–30)

## 2023-01-28 MED ORDER — LACTATED RINGERS IV SOLN: INTRAVENOUS | Status: DC

## 2023-01-28 NOTE — ED Notes (Signed)
Patient is awake. Given water to drink.

## 2023-01-28 NOTE — ED Provider Notes (Signed)
[place] EMERGENCY DEPARTMENT AT [facility] Provider Note   CSN: [id number] Arrival date & time: 01/28/23  1847     History  Chief Complaint  Patient presents with   Drug Overdose    Courtney Hughes is a 42 y.o. female.  42 year old female presents after unintentional overdose.  Patient drinks alcohol daily and was trying to detox herself by Co. ingesting Ambien so she would sleep through her alcohol withdrawal.  Denies that this was a suicide attempt.  Husband found patient who was somnolent and called EMS and patient transported here.       Home Medications Prior to Admission medications   Medication Sig Start Date End Date Taking? Authorizing Provider  amLODipine (NORVASC) 10 MG tablet Take 10 mg by mouth every evening. 12/01/21   [provider]  clonazePAM (KLONOPIN) 0.5 MG tablet Take 1 tablet (0.5 mg total) by mouth 2 (two) times daily as needed for anxiety. 01/03/22   Marguerita Merles Latif, DO  cloNIDine (CATAPRES) 0.1 MG tablet Take 0.1 mg by mouth 4 (four) times daily as needed (high blood pressure). 11/24/21   [provider]  ibuprofen (ADVIL) 200 MG tablet Take 200 mg by mouth every 6 (six) hours as needed for moderate pain.    [provider]  Norgestimate-Ethinyl Estradiol Triphasic 0.18/0.215/0.25 MG-35 MCG tablet Take 1 tablet by mouth daily. 12/06/21   [provider]      Allergies    Patient has no known allergies.    Review of Systems   Review of Systems  Unable to perform ROS: Mental status change    Physical Exam Updated Vital Signs BP (!) 130/95 (BP Location: Right Arm)   Pulse 76   Temp 98.1 F (36.7 C) (Oral)   Resp (!) 24   SpO2 96%  Physical Exam Vitals and nursing note reviewed.  Constitutional:      General: She is not in acute distress.    Appearance: Normal appearance. She is well-developed. She is not toxic-appearing.  HENT:     Head: Normocephalic and atraumatic.  Eyes:     General:  Lids are normal.     Conjunctiva/sclera: Conjunctivae normal.     Pupils: Pupils are equal, round, and reactive to light.  Neck:     Thyroid: No thyroid mass.     Trachea: No tracheal deviation.  Cardiovascular:     Rate and Rhythm: Normal rate and regular rhythm.     Heart sounds: Normal heart sounds. No murmur heard.    No gallop.  Pulmonary:     Effort: Pulmonary effort is normal. No respiratory distress.     Breath sounds: Normal breath sounds. No stridor. No decreased breath sounds, wheezing, rhonchi or rales.  Abdominal:     General: There is no distension.     Palpations: Abdomen is soft.     Tenderness: There is no abdominal tenderness. There is no rebound.  Musculoskeletal:        General: No tenderness. Normal range of motion.     Cervical back: Normal range of motion and neck supple.  Skin:    General: Skin is warm and dry.     Findings: No abrasion or rash.  Neurological:     Mental Status: She is oriented to person, place, and time. She is lethargic and confused.     GCS: GCS eye subscore is 4. GCS verbal subscore is 5. GCS motor subscore is 6.     Cranial Nerves: No  cranial nerve deficit.     Sensory: No sensory deficit.     Motor: Motor function is intact.     Comments: All 4 extremities appropriately.  Psychiatric:        Attention and Perception: She is inattentive.        Mood and Affect: Affect is flat.        Speech: Speech is delayed.        Behavior: Behavior normal.        Thought Content: Thought content does not include suicidal ideation. Thought content does not include suicidal plan.     ED Results / Procedures / Treatments   Labs (all labs ordered are listed, but only abnormal results are displayed) Labs Reviewed - No data to display  EKG None  Radiology No results found.  Procedures Procedures    Medications Ordered in ED Medications  lactated ringers infusion (has no administration in time range)    ED Course/ Medical Decision  Making/ A&P                             Medical Decision Making Amount and/or Complexity of Data Reviewed Labs: ordered. ECG/medicine tests: ordered.  Risk Prescription drug management.   Patient able to protect her airway here.  Patient's alcohol level elevated at 171.  Patient will be monitored here for several hours until her mental status improves.  Will sign out to next provider       Final Clinical Impression(s) / ED Diagnoses Final diagnoses:  None    Rx / DC Orders ED Discharge Orders     None         Lorre Nick, MD 01/28/23 2056

## 2023-01-28 NOTE — ED Triage Notes (Signed)
Per EMS, Pt, from home, presents after taking "extra Ambien to sleep" last night.  Pt reported to EMS that she was trying to detox from alcohol and had heard she "could sleep through the detox symptoms."  Pt's husband reported she had slept all day, woke up and drank a glass of wine, and went back to sleep.  Pt will arouse to voice.  Denies pain.   Unknown amount of Ambien taken.

## 2023-01-28 NOTE — Discharge Instructions (Signed)
Do not mix alcohol and Ambien

## 2023-01-29 LAB — RAPID URINE DRUG SCREEN, HOSP PERFORMED
Amphetamines: NOT DETECTED
Barbiturates: NOT DETECTED
Benzodiazepines: NOT DETECTED
Cocaine: NOT DETECTED
Opiates: NOT DETECTED
Tetrahydrocannabinol: NOT DETECTED

## 2023-01-29 NOTE — ED Notes (Signed)
Patient ambulated to the bathroom for urine sample

## 2023-07-02 IMAGING — CT CT HEAD W/O CM
3 series · 16 of 47 positions shown, 19 images · non-contrast
Comparison: None.

CLINICAL DATA: Trauma, fall down steps, ETOH, head/neck injury

EXAM:
CT HEAD WITHOUT CONTRAST
CT CERVICAL SPINE WITHOUT CONTRAST
TECHNIQUE: Multidetector CT imaging of the head and cervical spine was
performed following the standard protocol without intravenous
contrast. Multiplanar CT image reconstructions of the cervical spine
were also generated.

[Series 2: head 5.0 h30s · axial · 0.43mm/px · z∈[-350,-220]mm · 10 of 32 slices shown, 13 images]
[im 3/32  brain]
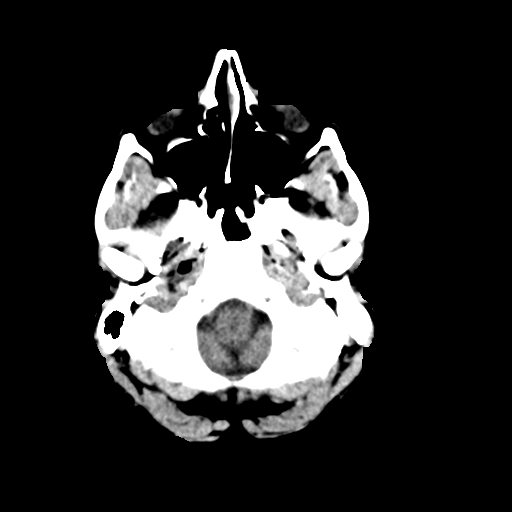
[im 3/32  bone]
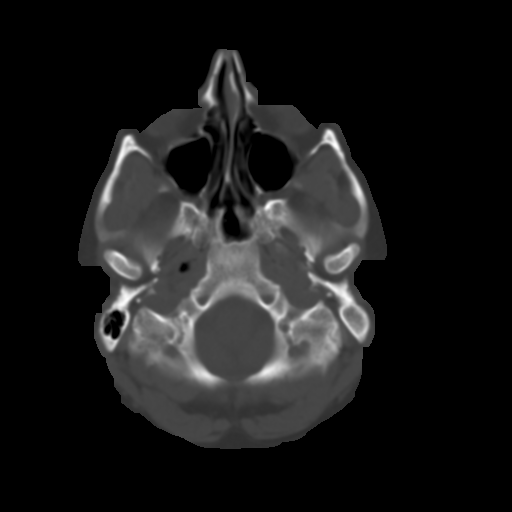
[im 6/32  brain]
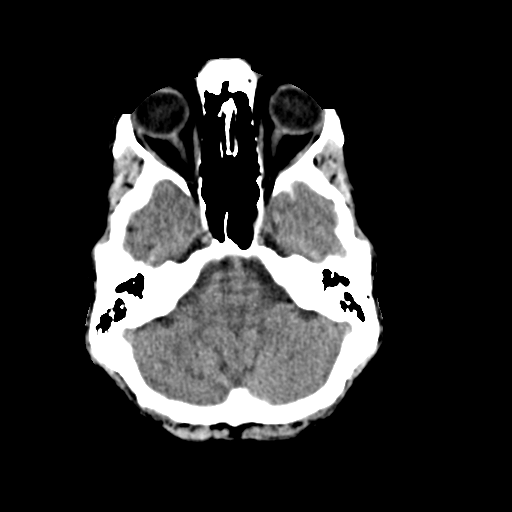
[im 9/32  brain]
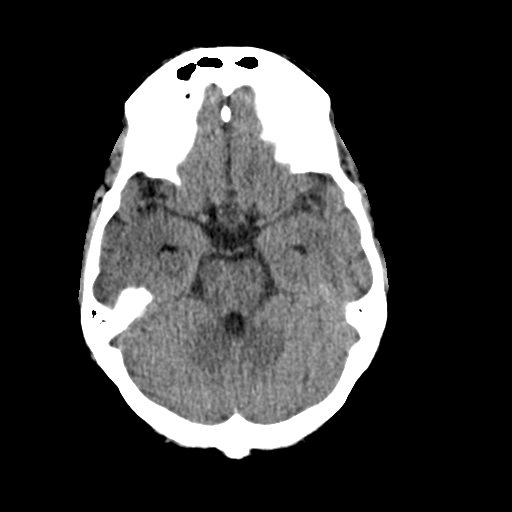
[im 11/32  brain]
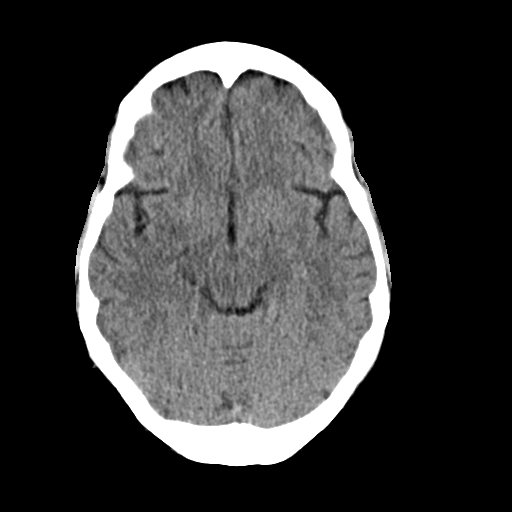
[im 14/32  brain]
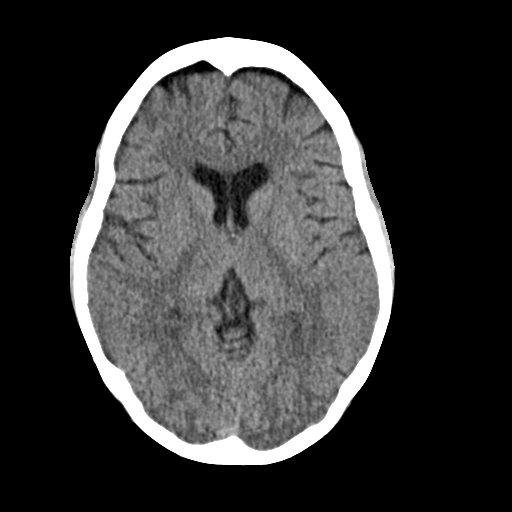
[im 14/32  bone]
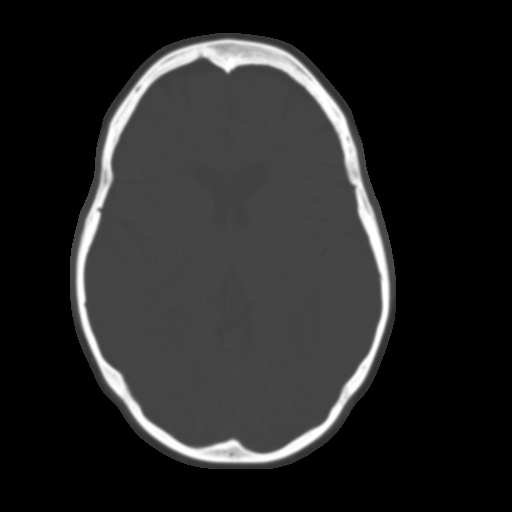
[im 18/32  brain]
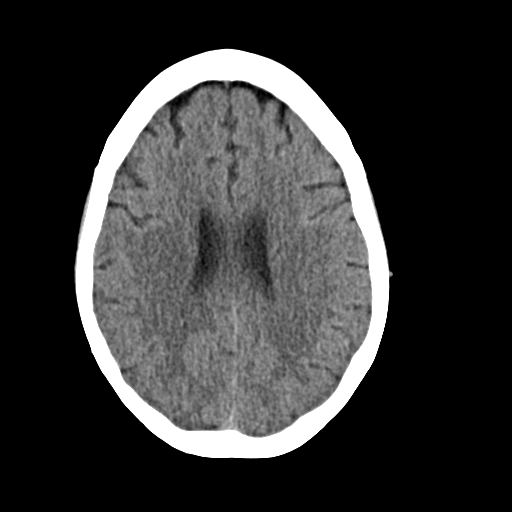
[im 21/32  brain]
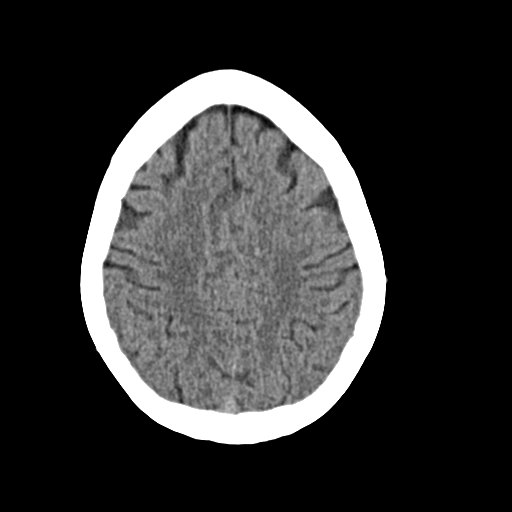
[im 24/32  brain]
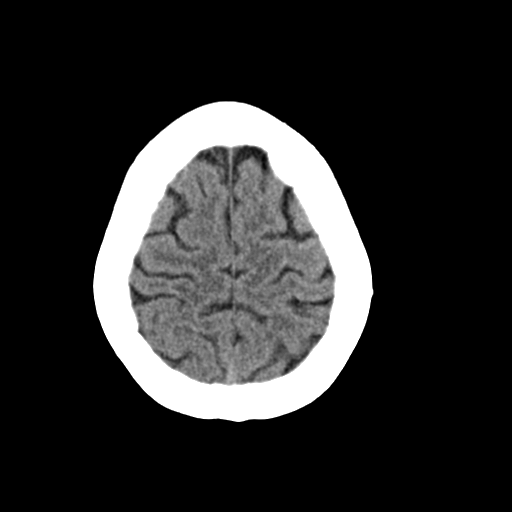
[im 26/32  brain]
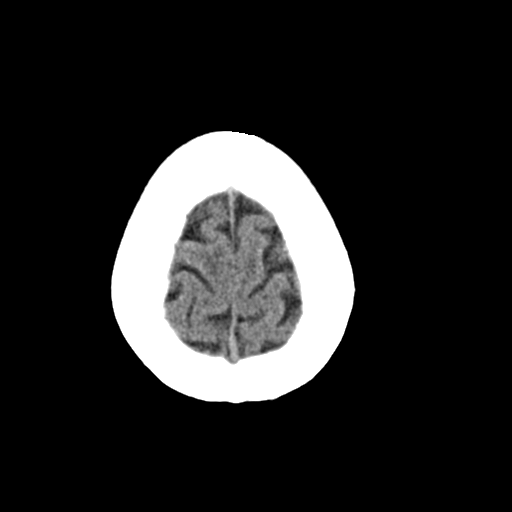
[im 26/32  bone]
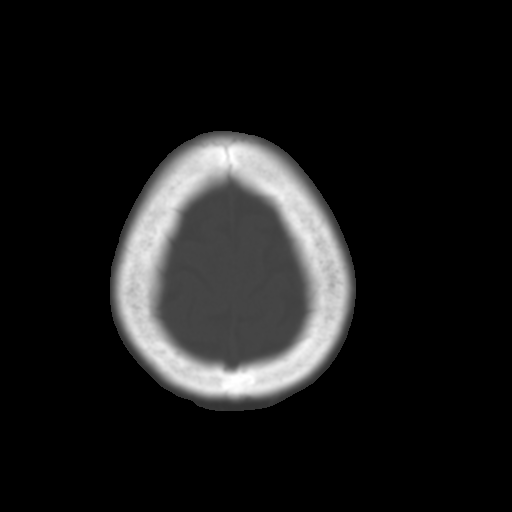
[im 29/32  brain]
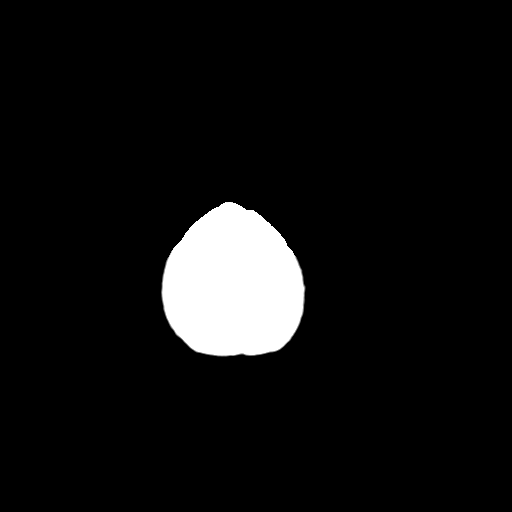

[Series 4: head 3.0 mpr cor · coronal · 0.32mm/px · 3 of 67 slices shown]
[im 23/67  brain]
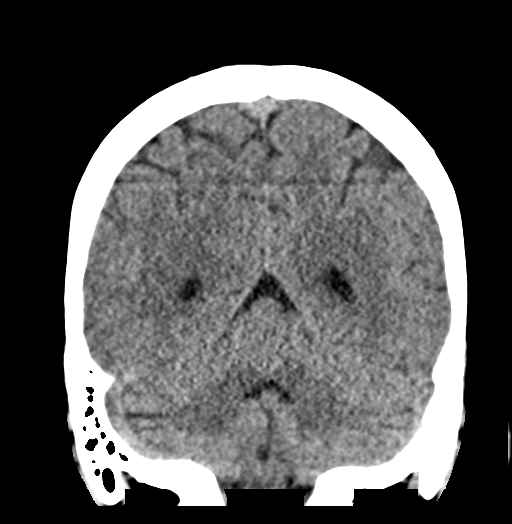
[im 30/67  brain]
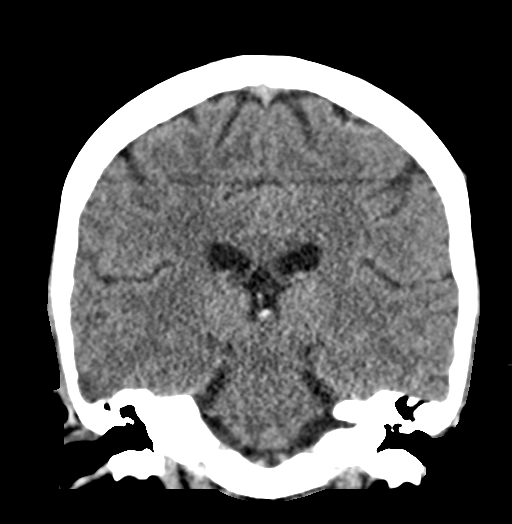
[im 37/67  brain]
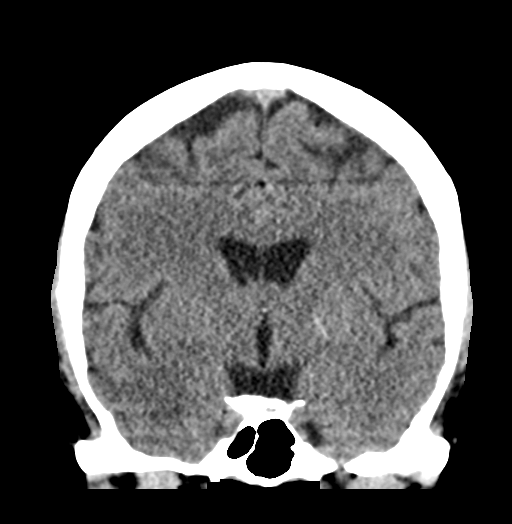

[Series 5: head 3.0 mpr sag · sagittal · 0.33mm/px · 3 of 53 slices shown]
[im 18/53  brain]
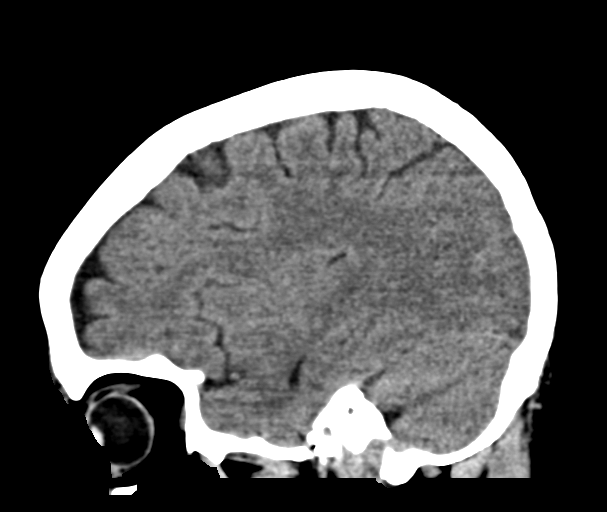
[im 27/53  brain]
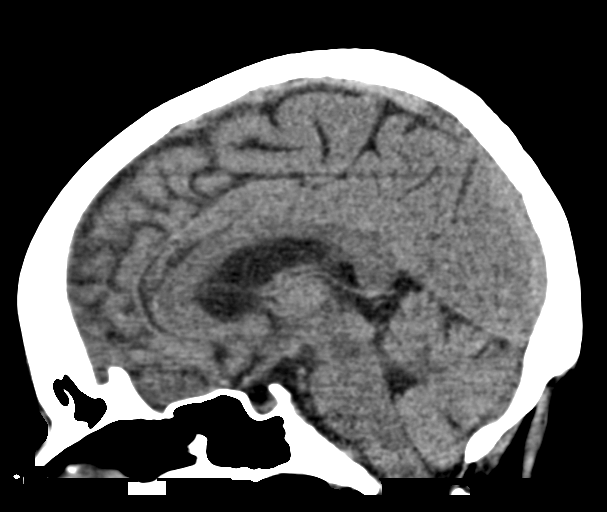
[im 35/53  brain]
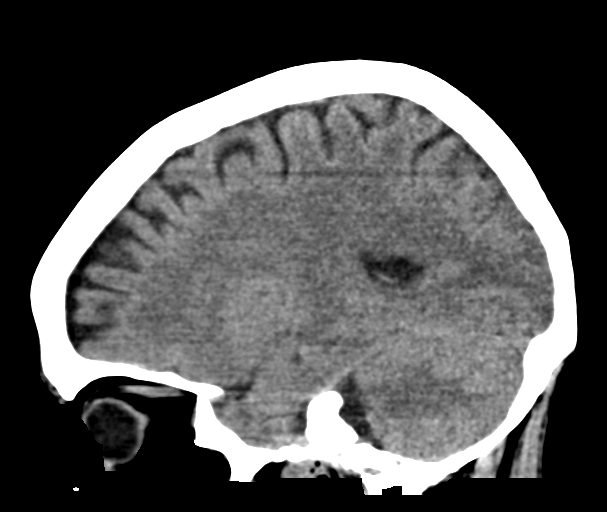

[16 of 47 positions shown; findings below may reference images not displayed]

FINDINGS: CT HEAD FINDINGS

Brain: No evidence of acute infarction, hemorrhage, hydrocephalus,
extra-axial collection or mass lesion/mass effect.

Vascular: No hyperdense vessel or unexpected calcification.

Skull: Normal. Negative for fracture or focal lesion.

Sinuses/Orbits: The visualized paranasal sinuses are essentially
clear. The mastoid air cells are unopacified.

Other: None.

CT CERVICAL SPINE FINDINGS

Alignment: Normal cervical lordosis.

Skull base and vertebrae: No acute fracture. No primary bone lesion
or focal pathologic process.

Soft tissues and spinal canal: No prevertebral fluid or swelling. No
visible canal hematoma.

Disc levels: Intervertebral disc spaces are maintained. Spinal canal
is patent.

Upper chest: Mild patchy opacity/scarring at the right lung apex.

Other: None.
IMPRESSION: Normal head CT.

Normal cervical spine CT.

## 2023-07-07 IMAGING — XA DG HUMERUS 2V *R*
1 series · 2 of 2 positions shown · non-contrast
Comparison: None.

CLINICAL DATA: Intraoperative films

EXAM:
RIGHT HUMERUS - 2+ VIEW

[Series 1: unknown protocol · 0.30mm/px · 2 of 2 slices shown]
[im 1/2]
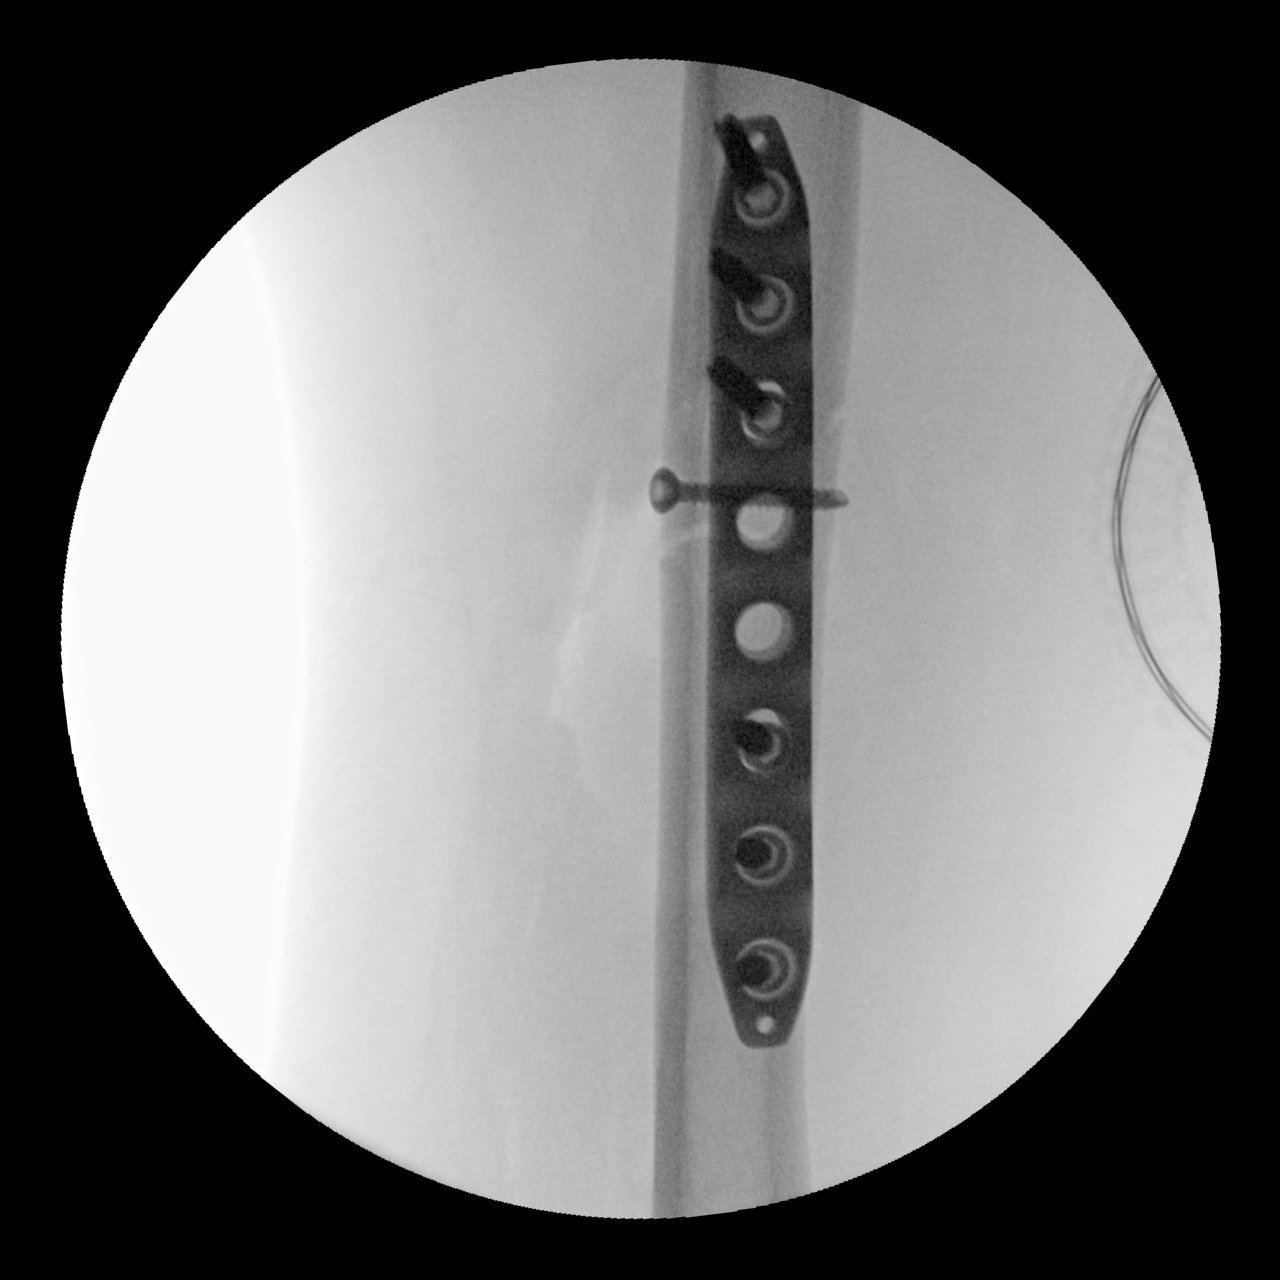
[im 2/2]
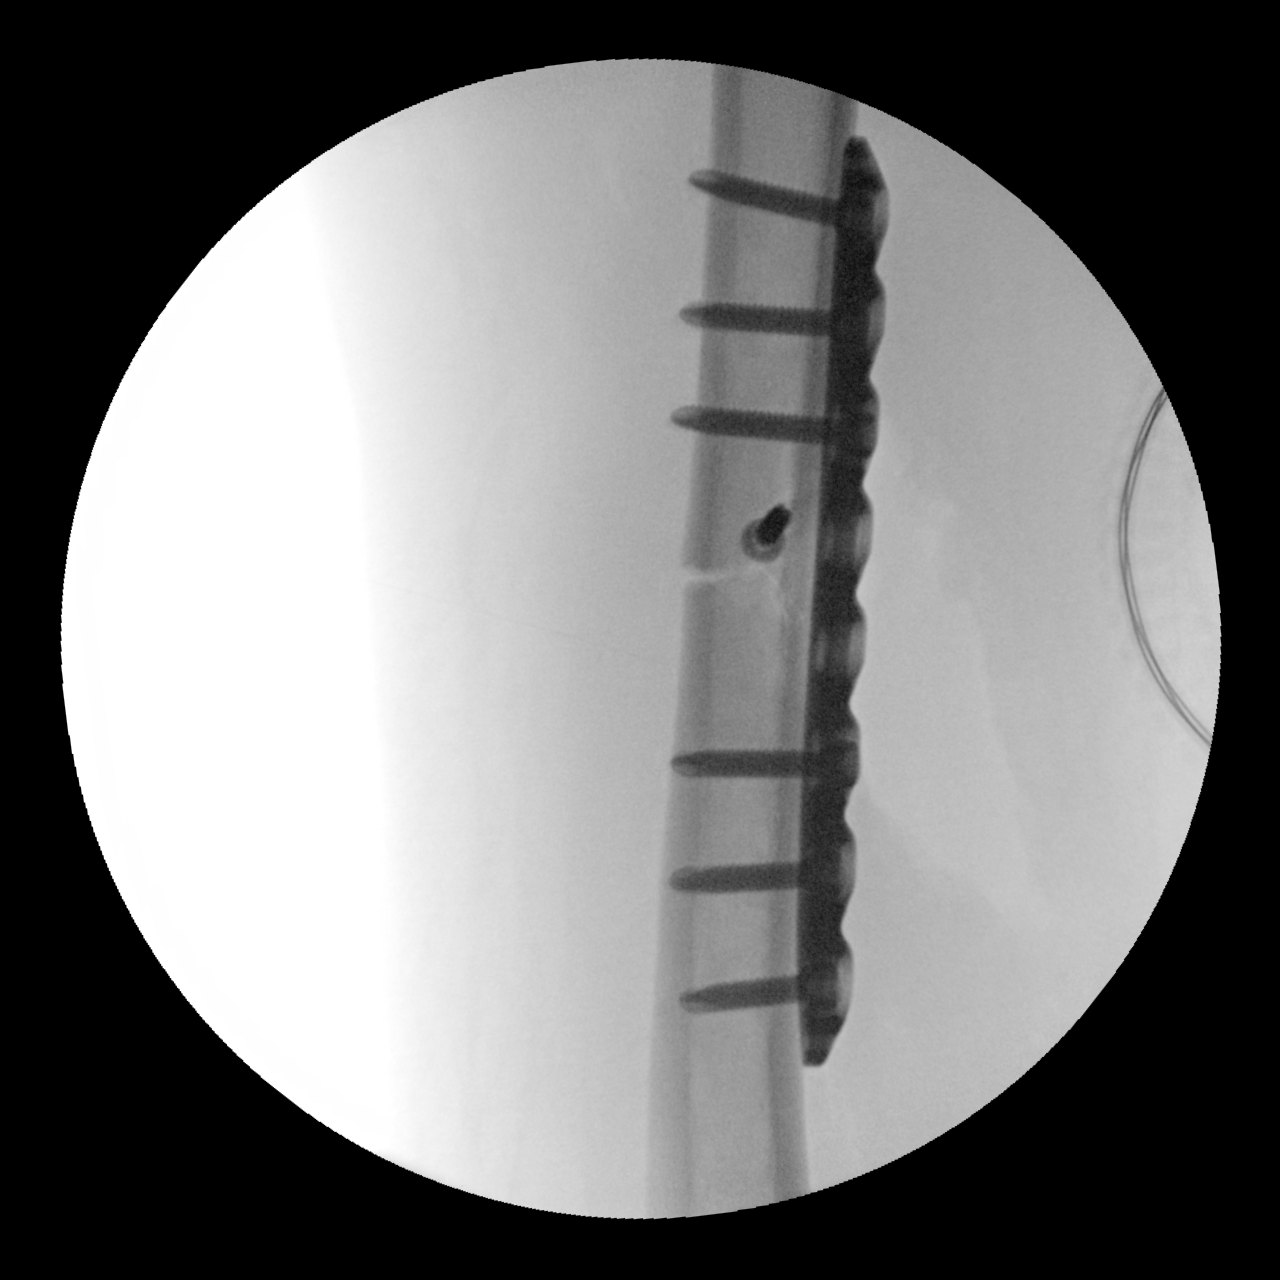

[2 of 2 positions shown; findings below may reference images not displayed]

FINDINGS: A total of 2 fluoroscopic spot films are submitted for review taking
during intraoperative repair of the humerus. The visualized humeral
shaft is status post internal fixation. The bone appears near
normally aligned, without evidence of hardware complicating feature.
IMPRESSION: Fluoroscopic films taken during internal fixation of the humerus.
Refer to operative report for full details.
